# Patient Record
Sex: Female | Born: 1965 | Race: White | Hispanic: No | Marital: Married | Smoking: Never smoker
Health system: Southern US, Community
[De-identification: ages and names within clinical notes are randomized; demographics above are authoritative.]

## PROBLEM LIST (undated history)

## (undated) DIAGNOSIS — F32A Depression, unspecified: Secondary | ICD-10-CM

## (undated) DIAGNOSIS — R87619 Unspecified abnormal cytological findings in specimens from cervix uteri: Secondary | ICD-10-CM

## (undated) DIAGNOSIS — C801 Malignant (primary) neoplasm, unspecified: Secondary | ICD-10-CM

## (undated) DIAGNOSIS — F329 Major depressive disorder, single episode, unspecified: Secondary | ICD-10-CM

## (undated) DIAGNOSIS — J45909 Unspecified asthma, uncomplicated: Secondary | ICD-10-CM

## (undated) DIAGNOSIS — Z8739 Personal history of other diseases of the musculoskeletal system and connective tissue: Secondary | ICD-10-CM

## (undated) DIAGNOSIS — A64 Unspecified sexually transmitted disease: Secondary | ICD-10-CM

## (undated) DIAGNOSIS — T7840XA Allergy, unspecified, initial encounter: Secondary | ICD-10-CM

## (undated) DIAGNOSIS — F419 Anxiety disorder, unspecified: Secondary | ICD-10-CM

## (undated) HISTORY — DX: Anxiety disorder, unspecified: F41.9

## (undated) HISTORY — DX: Malignant (primary) neoplasm, unspecified: C80.1

## (undated) HISTORY — DX: Personal history of other diseases of the musculoskeletal system and connective tissue: Z87.39

## (undated) HISTORY — DX: Depression, unspecified: F32.A

## (undated) HISTORY — DX: Major depressive disorder, single episode, unspecified: F32.9

## (undated) HISTORY — DX: Unspecified asthma, uncomplicated: J45.909

## (undated) HISTORY — DX: Allergy, unspecified, initial encounter: T78.40XA

## (undated) HISTORY — DX: Unspecified abnormal cytological findings in specimens from cervix uteri: R87.619

## (undated) HISTORY — PX: SKIN CANCER EXCISION: SHX779

## (undated) HISTORY — DX: Unspecified sexually transmitted disease: A64

---

## 1998-08-13 DIAGNOSIS — R87619 Unspecified abnormal cytological findings in specimens from cervix uteri: Secondary | ICD-10-CM

## 1998-08-13 HISTORY — DX: Unspecified abnormal cytological findings in specimens from cervix uteri: R87.619

## 1998-08-13 HISTORY — PX: CERVICAL BIOPSY  W/ LOOP ELECTRODE EXCISION: SUR135

## 2015-01-12 ENCOUNTER — Ambulatory Visit (INDEPENDENT_AMBULATORY_CARE_PROVIDER_SITE_OTHER): Payer: 59 | Admitting: Family Medicine

## 2015-01-12 VITALS — BP 128/80 | HR 73 | Temp 97.6°F | Resp 17 | Ht <= 58 in | Wt 136.4 lb

## 2015-01-12 DIAGNOSIS — J302 Other seasonal allergic rhinitis: Secondary | ICD-10-CM | POA: Diagnosis not present

## 2015-01-12 DIAGNOSIS — H109 Unspecified conjunctivitis: Secondary | ICD-10-CM

## 2015-01-12 MED ORDER — OFLOXACIN 0.3 % OP SOLN: 1.0000 [drp] | Freq: Four times a day (QID) | OPHTHALMIC | Status: DC

## 2015-01-12 NOTE — Progress Notes (Signed)
      Chief Complaint:  Chief Complaint  Patient presents with  . Eye Burn    discharge in morning, red     HPI: Kathleen Craig is a 49 y.o. female who is here for  bilateral itching, burning red eyes with minimal discharge in the mornings for the last 1 week. She thought it was just allergies and has been taking allergy medications. She does have a history of allergies. But they are only seasonal. It has not helped. Denies any vision changes, light sensitivity or eye pain.  History reviewed. No pertinent past medical history. History reviewed. No pertinent past surgical history. History   Social History  . Marital Status: Single    Spouse Name: N/A  . Number of Children: N/A  . Years of Education: N/A   Social History Main Topics  . Smoking status: Never Smoker   . Smokeless tobacco: Not on file  . Alcohol Use: Not on file  . Drug Use: Not on file  . Sexual Activity: Not on file   Other Topics Concern  . None   Social History Narrative  . None   Family History  Problem Relation Age of Onset  . Heart disease Maternal Grandfather   . Hypertension Maternal Grandfather    Allergies  Allergen Reactions  . Sulfur Hives   Prior to Admission medications   Not on File     ROS: The patient denies fevers, chills, night sweats, unintentional weight loss, chest pain, palpitations, wheezing, dyspnea on exertion, nausea, vomiting, abdominal pain, dysuria, hematuria, melena, numbness, weakness, or tingling.   All other systems have been reviewed and were otherwise negative with the exception of those mentioned in the HPI and as above.    PHYSICAL EXAM: Filed Vitals:   01/12/15 1900  BP: 128/80  Pulse: 73  Temp: 97.6 F (36.4 C)  Resp: 17   Filed Vitals:   01/12/15 1900  Height: 4\' 10"  (1.473 m)  Weight: 136 lb 6.4 oz (61.871 kg)   Body mass index is 28.52 kg/(m^2).   General: Alert, no acute distress HEENT:  Normocephalic, atraumatic, oropharynx patent. EOMI,  PERRLA, fundodoscopic exam normal, bilateral erythematous sclera Cardiovascular:  Regular rate and rhythm, no rubs murmurs or gallops.  No Carotid bruits, radial pulse intact. No pedal edema.  Respiratory: Clear to auscultation bilaterally.  No wheezes, rales, or rhonchi.  No cyanosis, no use of accessory musculature GI: No organomegaly, abdomen is soft and non-tender, positive bowel sounds.  No masses. Skin: No rashes. Neurologic: Facial musculature symmetric. Psychiatric: Patient is appropriate throughout our interaction. Lymphatic: No cervical lymphadenopathy Musculoskeletal: Gait intact.   LABS: No results found for this or any previous visit.   EKG/XRAY:   Primary read interpreted by Dr. Marin Comment at [facility].   ASSESSMENT/PLAN: Encounter Diagnoses  Name Primary?  . Bilateral conjunctivitis Yes  . Seasonal allergies    Continue to take anti-histamine, also prescribed Ocuflox Follow up as needed  Gross sideeffects, risk and benefits, and alternatives of medications d/w patient. Patient is aware that all medications have potential sideeffects and we are unable to predict every sideeffect or drug-drug interaction that may occur.  LE, [place], DO 01/14/2015 3:17 AM

## 2015-03-23 ENCOUNTER — Encounter: Payer: Self-pay | Admitting: Physician Assistant

## 2015-03-23 ENCOUNTER — Ambulatory Visit (INDEPENDENT_AMBULATORY_CARE_PROVIDER_SITE_OTHER): Payer: 59 | Admitting: Physician Assistant

## 2015-03-23 VITALS — BP 122/79 | HR 74 | Temp 98.7°F | Resp 16 | Ht <= 58 in | Wt 136.4 lb

## 2015-03-23 DIAGNOSIS — A6000 Herpesviral infection of urogenital system, unspecified: Secondary | ICD-10-CM | POA: Insufficient documentation

## 2015-03-23 DIAGNOSIS — F32A Depression, unspecified: Secondary | ICD-10-CM | POA: Insufficient documentation

## 2015-03-23 DIAGNOSIS — F418 Other specified anxiety disorders: Secondary | ICD-10-CM | POA: Diagnosis not present

## 2015-03-23 DIAGNOSIS — R12 Heartburn: Secondary | ICD-10-CM | POA: Diagnosis not present

## 2015-03-23 DIAGNOSIS — F329 Major depressive disorder, single episode, unspecified: Secondary | ICD-10-CM | POA: Insufficient documentation

## 2015-03-23 DIAGNOSIS — J4520 Mild intermittent asthma, uncomplicated: Secondary | ICD-10-CM | POA: Diagnosis not present

## 2015-03-23 DIAGNOSIS — F419 Anxiety disorder, unspecified: Secondary | ICD-10-CM

## 2015-03-23 MED ORDER — VALACYCLOVIR HCL 500 MG PO TABS: ORAL_TABLET | ORAL | Status: DC

## 2015-03-23 MED ORDER — PANTOPRAZOLE SODIUM 40 MG PO TBEC: 40.0000 mg | DELAYED_RELEASE_TABLET | Freq: Every day | ORAL | Status: DC

## 2015-03-23 MED ORDER — LORAZEPAM 0.5 MG PO TABS: 0.5000 mg | ORAL_TABLET | Freq: Every day | ORAL | Status: DC | PRN

## 2015-03-23 MED ORDER — ESCITALOPRAM OXALATE 20 MG PO TABS: 20.0000 mg | ORAL_TABLET | Freq: Every day | ORAL | Status: DC

## 2015-03-23 MED ORDER — FLUTICASONE-SALMETEROL 100-50 MCG/DOSE IN AEPB: 1.0000 | INHALATION_SPRAY | Freq: Two times a day (BID) | RESPIRATORY_TRACT | Status: DC

## 2015-03-23 MED ORDER — ALBUTEROL SULFATE HFA 108 (90 BASE) MCG/ACT IN AERS: 2.0000 | INHALATION_SPRAY | Freq: Four times a day (QID) | RESPIRATORY_TRACT | Status: DC | PRN

## 2015-03-23 NOTE — Progress Notes (Signed)
   Kathleen Craig  MRN: [id number] DOB: 01-25-1966  Subjective:  Pt presents to clinic because she recently moved to [place] and she needs to establish care.  She is doing well with her medications.  She is in need for a PAP and she would like to schedule a CPE for that in the upcoming months.  Patient Active Problem List   Diagnosis Date Noted  . Heartburn 03/23/2015  . Anxiety and depression 03/23/2015  . Asthma, mild intermittent, well-controlled 03/23/2015  . Genital herpes 03/23/2015    No current outpatient prescriptions on file prior to visit.   No current facility-administered medications on file prior to visit.    Allergies  Allergen Reactions  . Sulfur Hives    Review of Systems Objective:  BP 122/79 mmHg  Pulse 74  Temp(Src) 98.7 F (37.1 C) (Oral)  Resp 16  Ht 4\' 10"  (1.473 m)  Wt 136 lb 6.4 oz (61.871 kg)  BMI 28.52 kg/m2  SpO2 98%  LMP 03/20/2015  Physical Exam  Constitutional: She is oriented to person, place, and time and well-developed, well-nourished, and in no distress.  HENT:  Head: Normocephalic and atraumatic.  Right Ear: Hearing and external ear normal.  Left Ear: Hearing and external ear normal.  Eyes: Conjunctivae are normal.  Neck: Normal range of motion.  Cardiovascular: Normal rate, regular rhythm and normal heart sounds.   No murmur heard. Pulmonary/Chest: Effort normal and breath sounds normal. She has no wheezes.  Neurological: She is alert and oriented to person, place, and time. Gait normal.  Skin: Skin is warm and dry.  Psychiatric: Mood, memory, affect and judgment normal.  Vitals reviewed.   Assessment and Plan :  Heartburn - Plan: pantoprazole (PROTONIX) 40 MG tablet  Anxiety and depression - Plan: LORazepam (ATIVAN) 0.5 MG tablet, escitalopram (LEXAPRO) 20 MG tablet  Asthma, mild intermittent, well-controlled - Plan: Fluticasone-Salmeterol (ADVAIR) 100-50 MCG/DOSE AEPB, albuterol (PROVENTIL HFA;VENTOLIN HFA) 108 (90 BASE)  MCG/ACT inhaler  Genital herpes - Plan: valACYclovir (VALTREX) 500 MG tablet  Kathleen Hummingbird PA-C  [facility] 03/23/2015 3:50 PM

## 2015-03-30 ENCOUNTER — Other Ambulatory Visit: Payer: Self-pay

## 2015-03-30 DIAGNOSIS — J4520 Mild intermittent asthma, uncomplicated: Secondary | ICD-10-CM

## 2015-03-30 MED ORDER — ALBUTEROL SULFATE HFA 108 (90 BASE) MCG/ACT IN AERS: 2.0000 | INHALATION_SPRAY | Freq: Four times a day (QID) | RESPIRATORY_TRACT | Status: DC | PRN

## 2015-05-19 ENCOUNTER — Encounter: Payer: Self-pay | Admitting: Physician Assistant

## 2015-05-26 ENCOUNTER — Ambulatory Visit (INDEPENDENT_AMBULATORY_CARE_PROVIDER_SITE_OTHER): Payer: No Typology Code available for payment source | Admitting: Physician Assistant

## 2015-05-26 ENCOUNTER — Other Ambulatory Visit: Payer: Self-pay | Admitting: Physician Assistant

## 2015-05-26 ENCOUNTER — Encounter: Payer: Self-pay | Admitting: Physician Assistant

## 2015-05-26 VITALS — BP 118/83 | HR 71 | Temp 98.1°F | Resp 16 | Ht <= 58 in | Wt 139.0 lb

## 2015-05-26 DIAGNOSIS — Z85828 Personal history of other malignant neoplasm of skin: Secondary | ICD-10-CM | POA: Diagnosis not present

## 2015-05-26 DIAGNOSIS — Z01419 Encounter for gynecological examination (general) (routine) without abnormal findings: Secondary | ICD-10-CM | POA: Diagnosis not present

## 2015-05-26 DIAGNOSIS — Z130 Encounter for screening for diseases of the blood and blood-forming organs and certain disorders involving the immune mechanism: Secondary | ICD-10-CM | POA: Diagnosis not present

## 2015-05-26 DIAGNOSIS — F329 Major depressive disorder, single episode, unspecified: Secondary | ICD-10-CM

## 2015-05-26 DIAGNOSIS — Z9889 Other specified postprocedural states: Secondary | ICD-10-CM

## 2015-05-26 DIAGNOSIS — Z1159 Encounter for screening for other viral diseases: Secondary | ICD-10-CM | POA: Diagnosis not present

## 2015-05-26 DIAGNOSIS — L989 Disorder of the skin and subcutaneous tissue, unspecified: Secondary | ICD-10-CM

## 2015-05-26 DIAGNOSIS — Z13228 Encounter for screening for other metabolic disorders: Secondary | ICD-10-CM

## 2015-05-26 DIAGNOSIS — Z1231 Encounter for screening mammogram for malignant neoplasm of breast: Secondary | ICD-10-CM | POA: Diagnosis not present

## 2015-05-26 DIAGNOSIS — Z13220 Encounter for screening for lipoid disorders: Secondary | ICD-10-CM | POA: Diagnosis not present

## 2015-05-26 DIAGNOSIS — A6000 Herpesviral infection of urogenital system, unspecified: Secondary | ICD-10-CM

## 2015-05-26 DIAGNOSIS — J4520 Mild intermittent asthma, uncomplicated: Secondary | ICD-10-CM

## 2015-05-26 DIAGNOSIS — Z23 Encounter for immunization: Secondary | ICD-10-CM

## 2015-05-26 DIAGNOSIS — Z1329 Encounter for screening for other suspected endocrine disorder: Secondary | ICD-10-CM | POA: Diagnosis not present

## 2015-05-26 DIAGNOSIS — F32A Depression, unspecified: Secondary | ICD-10-CM

## 2015-05-26 DIAGNOSIS — Z0000 Encounter for general adult medical examination without abnormal findings: Secondary | ICD-10-CM | POA: Diagnosis not present

## 2015-05-26 DIAGNOSIS — H9193 Unspecified hearing loss, bilateral: Secondary | ICD-10-CM

## 2015-05-26 DIAGNOSIS — Z114 Encounter for screening for human immunodeficiency virus [HIV]: Secondary | ICD-10-CM | POA: Diagnosis not present

## 2015-05-26 DIAGNOSIS — R12 Heartburn: Secondary | ICD-10-CM

## 2015-05-26 DIAGNOSIS — Z113 Encounter for screening for infections with a predominantly sexual mode of transmission: Secondary | ICD-10-CM

## 2015-05-26 DIAGNOSIS — F418 Other specified anxiety disorders: Secondary | ICD-10-CM

## 2015-05-26 DIAGNOSIS — F419 Anxiety disorder, unspecified: Secondary | ICD-10-CM

## 2015-05-26 LAB — COMPLETE METABOLIC PANEL WITH GFR
ALT: 13 U/L (ref 6–29)
AST: 15 U/L (ref 10–35)
Albumin: 4 g/dL (ref 3.6–5.1)
Alkaline Phosphatase: 78 U/L (ref 33–115)
BILIRUBIN TOTAL: 0.4 mg/dL (ref 0.2–1.2)
BUN: 9 mg/dL (ref 7–25)
CHLORIDE: 101 mmol/L (ref 98–110)
CO2: 25 mmol/L (ref 20–31)
CREATININE: 0.89 mg/dL (ref 0.50–1.10)
Calcium: 8.9 mg/dL (ref 8.6–10.2)
GFR, Est African American: 89 mL/min (ref >=60)
GFR, Est Non African American: 77 mL/min (ref >=60)
GLUCOSE: 74 mg/dL (ref 65–99)
Potassium: 3.9 mmol/L (ref 3.5–5.3)
SODIUM: 137 mmol/L (ref 135–146)
Total Protein: 7.4 g/dL (ref 6.1–8.1)

## 2015-05-26 LAB — CBC WITH DIFFERENTIAL/PLATELET
BASOS ABS: 0.1 10*3/uL (ref 0.0–0.1)
BASOS PCT: 1 % (ref 0–1)
EOS ABS: 0.4 10*3/uL (ref 0.0–0.7)
Eosinophils Relative: 4 % (ref 0–5)
HCT: 38.9 % (ref 36.0–46.0)
Hemoglobin: 13 g/dL (ref 12.0–15.0)
LYMPHS ABS: 2.7 10*3/uL (ref 0.7–4.0)
Lymphocytes Relative: 29 % (ref 12–46)
MCH: 29 pg (ref 26.0–34.0)
MCHC: 33.4 g/dL (ref 30.0–36.0)
MCV: 86.8 fL (ref 78.0–100.0)
MONOS PCT: 9 % (ref 3–12)
MPV: 9.9 fL (ref 8.6–12.4)
Monocytes Absolute: 0.8 10*3/uL (ref 0.1–1.0)
Neutro Abs: 5.2 10*3/uL (ref 1.7–7.7)
Neutrophils Relative %: 57 % (ref 43–77)
PLATELETS: 266 10*3/uL (ref 150–400)
RBC: 4.48 MIL/uL (ref 3.87–5.11)
RDW: 13.4 % (ref 11.5–15.5)
WBC: 9.2 10*3/uL (ref 4.0–10.5)

## 2015-05-26 LAB — LIPID PANEL
Cholesterol: 209 mg/dL — ABNORMAL HIGH (ref 125–200)
HDL: 48 mg/dL (ref >=46)
LDL Cholesterol: 142 mg/dL — ABNORMAL HIGH (ref <130)
Total CHOL/HDL Ratio: 4.4 Ratio (ref <=5.0)
Triglycerides: 96 mg/dL (ref <150)
VLDL: 19 mg/dL (ref <30)

## 2015-05-26 LAB — TSH: TSH: 2.806 u[IU]/mL (ref 0.350–4.500)

## 2015-05-26 MED ORDER — ESCITALOPRAM OXALATE 20 MG PO TABS: 20.0000 mg | ORAL_TABLET | Freq: Every day | ORAL | Status: DC

## 2015-05-26 MED ORDER — FLUTICASONE-SALMETEROL 100-50 MCG/DOSE IN AEPB: 1.0000 | INHALATION_SPRAY | Freq: Two times a day (BID) | RESPIRATORY_TRACT | Status: DC

## 2015-05-26 MED ORDER — VALACYCLOVIR HCL 500 MG PO TABS: ORAL_TABLET | ORAL | Status: DC

## 2015-05-26 MED ORDER — PANTOPRAZOLE SODIUM 40 MG PO TBEC: 40.0000 mg | DELAYED_RELEASE_TABLET | Freq: Every day | ORAL | Status: DC

## 2015-05-26 NOTE — Progress Notes (Signed)
Kathleen Craig  MRN: [id number] DOB: 11-Aug-1966  Subjective:  Pt presents to clinic for her CPE.  She is doing well.  She only has a concern about a lesion on her scalp which has been there for about a year.  It does not bother her but she is scared because she has h/o basal cell removed from her clavicle 2010.  She is also worried about her hearing.  Finds she has to look at people when they talk with her and she feels like it is getting worse.  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 Heartburn 03/23/2015  . Anxiety and depression - rare use of Ativan - about 2x/month 03/23/2015  . Asthma, mild intermittent, well-controlled - almost no use of Albuterol 03/23/2015  . Genital herpes - using valtrex for supression 03/23/2015    Current Outpatient Prescriptions on File Prior to Visit  Medication Sig Dispense Refill  . albuterol (PROVENTIL HFA;VENTOLIN HFA) 108 (90 BASE) MCG/ACT inhaler Inhale 2 puffs into the lungs every 6 (six) hours as needed for wheezing or shortness of breath. 1 Inhaler 0  . LORazepam (ATIVAN) 0.5 MG tablet Take 1 tablet (0.5 mg total) by mouth daily as needed for anxiety. 30 tablet 0   No current facility-administered medications on file prior to visit.    Allergies  Allergen Reactions  . Sulfur Hives    Social History   Social History  . Marital Status: Single    Spouse Name: N/A  . Number of Children: N/A  . Years of Education: N/A   Occupational History  . accounting     [employer]   Social History Main Topics  . Smoking status: Never Smoker   . Smokeless tobacco: None  .  Alcohol Use: 0.0 oz/week    0 Standard drinks or equivalent per week     Comment: 4 drinks a week  . Drug Use: No  . Sexual Activity: Yes    Birth Control/ Protection: Surgical     Comment: husband vasectomy   Other Topics Concern  . None   Social History Narrative   Married   Event organiser with [employer]   Exercise - no   Diet - try to eat healthy - could make some changes    History reviewed. No pertinent past surgical history.  Family History  Problem Relation Age of Onset  . Heart disease Maternal Grandfather   . Hypertension Maternal Grandfather   . Diabetes Mother   . Hyperlipidemia Mother   . Hyperlipidemia Father   . Alzheimer's disease Paternal Grandfather     Review of Systems  Constitutional: Negative.   HENT: Positive for hearing loss (family h/o hearing loss- trouble with all people talking to her - she uses lip reading a lot).   Eyes: Negative.   Respiratory: Negative.   Cardiovascular: Negative.   Gastrointestinal: Negative.   Endocrine: Negative.   Genitourinary: Negative.   Musculoskeletal: Negative.   Skin: Negative.   Allergic/Immunologic: Positive for environmental allergies (uses Flonase).  Neurological: Negative.   Hematological: Negative.   Psychiatric/Behavioral: Negative.    Objective:  BP 118/83 mmHg  Pulse 71  Temp(Src) 98.1 F (36.7 C) (Oral)  Resp 16  Ht 4\' 10"  (1.473 m)  Wt 139 lb (63.05 kg)  BMI 29.06 kg/m2  Physical Exam  Constitutional: She is oriented to person, place, and time and well-developed, well-nourished, and in no distress.  HENT:  Head: Normocephalic and atraumatic.  Right Ear: Hearing, tympanic membrane, external ear and ear canal normal.  Left Ear: Hearing, tympanic membrane, external ear and ear canal normal.  Nose: Nose normal.  Mouth/Throat: Uvula is midline, oropharynx is clear and moist and mucous membranes are normal.  Eyes: Conjunctivae and EOM are normal. Pupils are equal, round, and  reactive to light.  Neck: Trachea normal and normal range of motion. Neck supple. No thyroid mass and no thyromegaly present.  Cardiovascular: Normal rate, regular rhythm and normal heart sounds.   No murmur heard. Pulmonary/Chest: Effort normal and breath sounds normal. She has no wheezes. Right breast exhibits no inverted nipple, no mass, no nipple discharge, no skin change and no tenderness. Left breast exhibits no inverted nipple, no mass, no nipple discharge, no skin change and no tenderness. Breasts are symmetrical.  Abdominal: Soft. Bowel sounds are normal. There is no tenderness.  Genitourinary: Vagina normal, uterus normal, cervix normal, right adnexa normal, left adnexa normal and vulva normal.  Musculoskeletal: Normal range of motion.  Lymphadenopathy:    She has no cervical adenopathy.  Neurological: She is alert and oriented to person, place, and time. She has normal motor skills, normal sensation, normal strength and normal reflexes. Gait normal.  Skin: Skin is warm and dry.  1/2 cm raised pearly lesion with visible blood vessels left scalp about 1in from hair line  Psychiatric: Mood, memory, affect and judgment normal.     Visual Acuity Screening   Right eye Left eye Both eyes  Without correction: 20/20 20/20 20/20   With correction:       Assessment and Plan :  Annual physical exam - anticipatory guidance given  Need for prophylactic vaccination and inoculation against influenza - Plan: Flu Vaccine QUAD 36+ mos IM  Encounter for routine gynecological examination - Plan: Pap IG, CT/NG NAA, and HPV (high risk)  Encounter for screening mammogram for breast cancer - Plan: MM DIGITAL SCREENING BILATERAL  Skin lesion of scalp - Plan: Ambulatory referral to Dermatology  H/O basal cell carcinoma excision - Plan: Ambulatory referral to Dermatology  Screening for deficiency anemia - Plan: CBC with Differential/Platelet  Screening for metabolic disorder - Plan: COMPLETE  METABOLIC PANEL WITH GFR  Screening cholesterol level - Plan: Lipid panel  Screening for thyroid disorder - Plan: TSH  Screening for HIV (human immunodeficiency virus) - Plan: HIV antibody  Need for hepatitis B screening test - Plan: Hepatitis B surface antibody  Need for hepatitis C screening test - Plan: Hepatitis C antibody  Screening for STD (sexually transmitted disease) - Plan: RPR  Need for Tdap vaccination - Plan: Tdap vaccine greater than or equal to 7yo IM  Asthma, mild intermittent, well-controlled - Plan: Fluticasone-Salmeterol (ADVAIR) 100-50 MCG/DOSE AEPB  Anxiety and depression - Plan: escitalopram (LEXAPRO) 20 MG tablet  Heartburn - Plan: pantoprazole (PROTONIX) 40 MG tablet  Genital herpes - Plan: valACYclovir (VALTREX) 500 MG tablet  Hearing loss, bilateral - Plan: Ambulatory referral to Audiology - our audiometry machine is not working and I think that we need to figure out where her hearing  deficit is to see if something needs to be done about it.  Windell Hummingbird PA-C  [facility] 05/26/2015 5:55 PM

## 2015-05-26 NOTE — Patient Instructions (Signed)
Health Maintenance, Female Adopting a healthy lifestyle and getting preventive care can go a long way to promote health and wellness. Talk with your health care provider about what schedule of regular examinations is right for you. This is a good chance for you to check in with your provider about disease prevention and staying healthy. In between checkups, there are plenty of things you can do on your own. Experts have done a lot of research about which lifestyle changes and preventive measures are most likely to keep you healthy. Ask your health care provider for more information. WEIGHT AND DIET  Eat a healthy diet  Be sure to include plenty of vegetables, fruits, low-fat dairy products, and lean protein.  Do not eat a lot of foods high in solid fats, added sugars, or salt.  Get regular exercise. This is one of the most important things you can do for your health.  Most adults should exercise for at least 150 minutes each week. The exercise should increase your heart rate and make you sweat (moderate-intensity exercise).  Most adults should also do strengthening exercises at least twice a week. This is in addition to the moderate-intensity exercise.  Maintain a healthy weight  Body mass index (BMI) is a measurement that can be used to identify possible weight problems. It estimates body fat based on height and weight. Your health care provider can help determine your BMI and help you achieve or maintain a healthy weight.  For females 20 years of age and older:   A BMI below 18.5 is considered underweight.  A BMI of 18.5 to 24.9 is normal.  A BMI of 25 to 29.9 is considered overweight.  A BMI of 30 and above is considered obese.  Watch levels of cholesterol and blood lipids  You should start having your blood tested for lipids and cholesterol at 47 years of age, then have this test every 5 years.  You may need to have your cholesterol levels checked more often if:  Your lipid  or cholesterol levels are high.  You are older than 47 years of age.  You are at high risk for heart disease.  CANCER SCREENING   Lung Cancer  Lung cancer screening is recommended for adults 55-80 years old who are at high risk for lung cancer because of a history of smoking.  A yearly low-dose CT scan of the lungs is recommended for people who:  Currently smoke.  Have quit within the past 15 years.  Have at least a 30-pack-year history of smoking. A pack year is smoking an average of one pack of cigarettes a day for 1 year.  Yearly screening should continue until it has been 15 years since you quit.  Yearly screening should stop if you develop a health problem that would prevent you from having lung cancer treatment.  Breast Cancer  Practice breast self-awareness. This means understanding how your breasts normally appear and feel.  It also means doing regular breast self-exams. Let your health care provider know about any changes, no matter how small.  If you are in your 20s or 30s, you should have a clinical breast exam (CBE) by a health care provider every 1-3 years as part of a regular health exam.  If you are 40 or older, have a CBE every year. Also consider having a breast X-ray (mammogram) every year.  If you have a family history of breast cancer, talk to your health care provider about genetic screening.  If you   are at high risk for breast cancer, talk to your health care provider about having an MRI and a mammogram every year.  Breast cancer gene (BRCA) assessment is recommended for women who have family members with BRCA-related cancers. BRCA-related cancers include:  Breast.  Ovarian.  Tubal.  Peritoneal cancers.  Results of the assessment will determine the need for genetic counseling and BRCA1 and BRCA2 testing. Cervical Cancer Your health care provider may recommend that you be screened regularly for cancer of the pelvic organs (ovaries, uterus, and  vagina). This screening involves a pelvic examination, including checking for microscopic changes to the surface of your cervix (Pap test). You may be encouraged to have this screening done every 3 years, beginning at age 21.  For women ages 30-65, health care providers may recommend pelvic exams and Pap testing every 3 years, or they may recommend the Pap and pelvic exam, combined with testing for human papilloma virus (HPV), every 5 years. Some types of HPV increase your risk of cervical cancer. Testing for HPV may also be done on women of any age with unclear Pap test results.  Other health care providers may not recommend any screening for nonpregnant women who are considered low risk for pelvic cancer and who do not have symptoms. Ask your health care provider if a screening pelvic exam is right for you.  If you have had past treatment for cervical cancer or a condition that could lead to cancer, you need Pap tests and screening for cancer for at least 20 years after your treatment. If Pap tests have been discontinued, your risk factors (such as having a new sexual partner) need to be reassessed to determine if screening should resume. Some women have medical problems that increase the chance of getting cervical cancer. In these cases, your health care provider may recommend more frequent screening and Pap tests. Colorectal Cancer  This type of cancer can be detected and often prevented.  Routine colorectal cancer screening usually begins at 47 years of age and continues through 47 years of age.  Your health care provider may recommend screening at an earlier age if you have risk factors for colon cancer.  Your health care provider may also recommend using home test kits to check for hidden blood in the stool.  A small camera at the end of a tube can be used to examine your colon directly (sigmoidoscopy or colonoscopy). This is done to check for the earliest forms of colorectal  cancer.  Routine screening usually begins at age 50.  Direct examination of the colon should be repeated every 5-10 years through 47 years of age. However, you may need to be screened more often if early forms of precancerous polyps or small growths are found. Skin Cancer  Check your skin from head to toe regularly.  Tell your health care provider about any new moles or changes in moles, especially if there is a change in a mole's shape or color.  Also tell your health care provider if you have a mole that is larger than the size of a pencil eraser.  Always use sunscreen. Apply sunscreen liberally and repeatedly throughout the day.  Protect yourself by wearing long sleeves, pants, a wide-brimmed hat, and sunglasses whenever you are outside. HEART DISEASE, DIABETES, AND HIGH BLOOD PRESSURE   High blood pressure causes heart disease and increases the risk of stroke. High blood pressure is more likely to develop in:  People who have blood pressure in the high end   of the normal range (130-139/85-89 mm Hg).  People who are overweight or obese.  People who are African American.  If you are 38-23 years of age, have your blood pressure checked every 3-5 years. If you are 61 years of age or older, have your blood pressure checked every year. You should have your blood pressure measured twice--once when you are at a hospital or clinic, and once when you are not at a hospital or clinic. Record the average of the two measurements. To check your blood pressure when you are not at a hospital or clinic, you can use:  An automated blood pressure machine at a pharmacy.  A home blood pressure monitor.  If you are between 45 years and 39 years old, ask your health care provider if you should take aspirin to prevent strokes.  Have regular diabetes screenings. This involves taking a blood sample to check your fasting blood sugar level.  If you are at a normal weight and have a low risk for diabetes,  have this test once every three years after 47 years of age.  If you are overweight and have a high risk for diabetes, consider being tested at a younger age or more often. PREVENTING INFECTION  Hepatitis B  If you have a higher risk for hepatitis B, you should be screened for this virus. You are considered at high risk for hepatitis B if:  You were born in a country where hepatitis B is common. Ask your health care provider which countries are considered high risk.  Your parents were born in a high-risk country, and you have not been immunized against hepatitis B (hepatitis B vaccine).  You have HIV or AIDS.  You use needles to inject street drugs.  You live with someone who has hepatitis B.  You have had sex with someone who has hepatitis B.  You get hemodialysis treatment.  You take certain medicines for conditions, including cancer, organ transplantation, and autoimmune conditions. Hepatitis C  Blood testing is recommended for:  Everyone born from [birth year] through 1963.  Anyone with known risk factors for hepatitis C. Sexually transmitted infections (STIs)  You should be screened for sexually transmitted infections (STIs) including gonorrhea and chlamydia if:  You are sexually active and are younger than 47 years of age.  You are older than 47 years of age and your health care provider tells you that you are at risk for this type of infection.  Your sexual activity has changed since you were last screened and you are at an increased risk for chlamydia or gonorrhea. Ask your health care provider if you are at risk.  If you do not have HIV, but are at risk, it may be recommended that you take a prescription medicine daily to prevent HIV infection. This is called pre-exposure prophylaxis (PrEP). You are considered at risk if:  You are sexually active and do not regularly use condoms or know the HIV status of your partner(s).  You take drugs by injection.  You are sexually  active with a partner who has HIV. Talk with your health care provider about whether you are at high risk of being infected with HIV. If you choose to begin PrEP, you should first be tested for HIV. You should then be tested every 3 months for as long as you are taking PrEP.  PREGNANCY   If you are premenopausal and you may become pregnant, ask your health care provider about preconception counseling.  If you may  become pregnant, take 400 to 800 micrograms (mcg) of folic acid every day.  If you want to prevent pregnancy, talk to your health care provider about birth control (contraception). OSTEOPOROSIS AND MENOPAUSE   Osteoporosis is a disease in which the bones lose minerals and strength with aging. This can result in serious bone fractures. Your risk for osteoporosis can be identified using a bone density scan.  If you are 61 years of age or older, or if you are at risk for osteoporosis and fractures, ask your health care provider if you should be screened.  Ask your health care provider whether you should take a calcium or vitamin D supplement to lower your risk for osteoporosis.  Menopause may have certain physical symptoms and risks.  Hormone replacement therapy may reduce some of these symptoms and risks. Talk to your health care provider about whether hormone replacement therapy is right for you.  HOME CARE INSTRUCTIONS   Schedule regular health, dental, and eye exams.  Stay current with your immunizations.   Do not use any tobacco products including cigarettes, chewing tobacco, or electronic cigarettes.  If you are pregnant, do not drink alcohol.  If you are breastfeeding, limit how much and how often you drink alcohol.  Limit alcohol intake to no more than 1 drink per day for nonpregnant women. One drink equals 12 ounces of beer, 5 ounces of wine, or 1 ounces of hard liquor.  Do not use street drugs.  Do not share needles.  Ask your health care provider for help if  you need support or information about quitting drugs.  Tell your health care provider if you often feel depressed.  Tell your health care provider if you have ever been abused or do not feel safe at home.   This information is not intended to replace advice given to you by your health care provider. Make sure you discuss any questions you have with your health care provider.   Document Released: 02/12/2011 Document Revised: 08/20/2014 Document Reviewed: 07/01/2013 Elsevier Interactive Patient Education [employer].

## 2015-05-27 LAB — HIV ANTIBODY (ROUTINE TESTING W REFLEX): HIV: NONREACTIVE

## 2015-05-27 LAB — HEPATITIS C ANTIBODY: HCV AB: NEGATIVE

## 2015-05-27 LAB — HEPATITIS B SURFACE ANTIBODY, QUANTITATIVE: HEPATITIS B-POST: 0 m[IU]/mL

## 2015-05-27 LAB — RPR

## 2015-05-30 ENCOUNTER — Other Ambulatory Visit: Payer: Self-pay

## 2015-05-30 DIAGNOSIS — Z1231 Encounter for screening mammogram for malignant neoplasm of breast: Secondary | ICD-10-CM

## 2015-05-30 LAB — HEPATITIS B SURFACE ANTIGEN: HEP B S AG: NEGATIVE

## 2015-05-31 LAB — PAP IG, CT-NG NAA, HPV HIGH-RISK
CHLAMYDIA PROBE AMP: NEGATIVE
GC PROBE AMP: NEGATIVE
HPV DNA High Risk: DETECTED — AB

## 2015-07-13 ENCOUNTER — Ambulatory Visit: Payer: No Typology Code available for payment source

## 2015-07-19 ENCOUNTER — Ambulatory Visit: Payer: 59 | Attending: Audiology | Admitting: Audiology

## 2015-07-19 DIAGNOSIS — H906 Mixed conductive and sensorineural hearing loss, bilateral: Secondary | ICD-10-CM

## 2015-07-19 DIAGNOSIS — H93292 Other abnormal auditory perceptions, left ear: Secondary | ICD-10-CM

## 2015-07-19 DIAGNOSIS — R94128 Abnormal results of other function studies of ear and other special senses: Secondary | ICD-10-CM | POA: Diagnosis present

## 2015-07-19 DIAGNOSIS — H93211 Auditory recruitment, right ear: Secondary | ICD-10-CM | POA: Diagnosis present

## 2015-07-19 DIAGNOSIS — R292 Abnormal reflex: Secondary | ICD-10-CM | POA: Insufficient documentation

## 2015-07-19 DIAGNOSIS — Z01118 Encounter for examination of ears and hearing with other abnormal findings: Secondary | ICD-10-CM | POA: Diagnosis present

## 2015-07-19 DIAGNOSIS — H748X9 Other specified disorders of middle ear and mastoid, unspecified ear: Secondary | ICD-10-CM
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
## 2015-08-09 ENCOUNTER — Ambulatory Visit
Admission: RE | Admit: 2015-08-09 | Discharge: 2015-08-09 | Disposition: A | Discharge status: Home / Self Care | Payer: 59 | Source: Ambulatory Visit | Attending: Physician Assistant | Admitting: Physician Assistant

## 2015-08-09 DIAGNOSIS — Z1231 Encounter for screening mammogram for malignant neoplasm of breast: Secondary | ICD-10-CM

## 2015-12-19 ENCOUNTER — Other Ambulatory Visit: Payer: Self-pay

## 2015-12-19 ENCOUNTER — Telehealth: Payer: Self-pay

## 2015-12-19 DIAGNOSIS — F419 Anxiety disorder, unspecified: Secondary | ICD-10-CM

## 2015-12-19 DIAGNOSIS — R12 Heartburn: Secondary | ICD-10-CM

## 2015-12-19 DIAGNOSIS — F32A Depression, unspecified: Secondary | ICD-10-CM

## 2015-12-19 DIAGNOSIS — F329 Major depressive disorder, single episode, unspecified: Secondary | ICD-10-CM

## 2015-12-19 MED ORDER — PANTOPRAZOLE SODIUM 40 MG PO TBEC: 40.0000 mg | DELAYED_RELEASE_TABLET | Freq: Every day | ORAL | Status: DC

## 2015-12-19 MED ORDER — ESCITALOPRAM OXALATE 20 MG PO TABS: 20.0000 mg | ORAL_TABLET | Freq: Every day | ORAL | Status: DC

## 2015-12-19 NOTE — Telephone Encounter (Signed)
Pt advised.

## 2015-12-19 NOTE — Telephone Encounter (Signed)
Pt is needing a diflucan rx   Best number [phone]

## 2015-12-19 NOTE — Telephone Encounter (Signed)
Rx refill Lexapro and Pantoprazole.

## 2016-05-09 ENCOUNTER — Ambulatory Visit (INDEPENDENT_AMBULATORY_CARE_PROVIDER_SITE_OTHER): Payer: 59 | Admitting: Physician Assistant

## 2016-05-09 ENCOUNTER — Encounter: Payer: Self-pay | Admitting: Physician Assistant

## 2016-05-09 VITALS — BP 122/72 | HR 84 | Temp 98.9°F | Resp 17 | Ht <= 58 in | Wt 150.0 lb

## 2016-05-09 DIAGNOSIS — Z23 Encounter for immunization: Secondary | ICD-10-CM | POA: Diagnosis not present

## 2016-05-09 DIAGNOSIS — E7800 Pure hypercholesterolemia, unspecified: Secondary | ICD-10-CM | POA: Diagnosis not present

## 2016-05-09 DIAGNOSIS — R12 Heartburn: Secondary | ICD-10-CM | POA: Diagnosis not present

## 2016-05-09 DIAGNOSIS — M62838 Other muscle spasm: Secondary | ICD-10-CM | POA: Diagnosis not present

## 2016-05-09 DIAGNOSIS — F418 Other specified anxiety disorders: Secondary | ICD-10-CM | POA: Diagnosis not present

## 2016-05-09 DIAGNOSIS — J4520 Mild intermittent asthma, uncomplicated: Secondary | ICD-10-CM

## 2016-05-09 DIAGNOSIS — F419 Anxiety disorder, unspecified: Secondary | ICD-10-CM

## 2016-05-09 DIAGNOSIS — F32A Depression, unspecified: Secondary | ICD-10-CM

## 2016-05-09 DIAGNOSIS — A6000 Herpesviral infection of urogenital system, unspecified: Secondary | ICD-10-CM

## 2016-05-09 DIAGNOSIS — F329 Major depressive disorder, single episode, unspecified: Secondary | ICD-10-CM

## 2016-05-09 LAB — COMPLETE METABOLIC PANEL WITH GFR
ALT: 13 U/L (ref 6–29)
AST: 16 U/L (ref 10–35)
Albumin: 3.9 g/dL (ref 3.6–5.1)
Alkaline Phosphatase: 85 U/L (ref 33–115)
BILIRUBIN TOTAL: 0.4 mg/dL (ref 0.2–1.2)
BUN: 12 mg/dL (ref 7–25)
CO2: 24 mmol/L (ref 20–31)
Calcium: 9.3 mg/dL (ref 8.6–10.2)
Chloride: 103 mmol/L (ref 98–110)
Creat: 0.92 mg/dL (ref 0.50–1.10)
GFR, EST AFRICAN AMERICAN: 85 mL/min (ref >=60)
GFR, EST NON AFRICAN AMERICAN: 74 mL/min (ref >=60)
Glucose, Bld: 94 mg/dL (ref 65–99)
POTASSIUM: 4.3 mmol/L (ref 3.5–5.3)
Sodium: 137 mmol/L (ref 135–146)
TOTAL PROTEIN: 7.1 g/dL (ref 6.1–8.1)

## 2016-05-09 LAB — LIPID PANEL
CHOL/HDL RATIO: 4.5 ratio (ref <=5.0)
Cholesterol: 212 mg/dL — ABNORMAL HIGH (ref 125–200)
HDL: 47 mg/dL (ref >=46)
LDL CALC: 136 mg/dL — AB (ref <130)
TRIGLYCERIDES: 145 mg/dL (ref <150)
VLDL: 29 mg/dL (ref <30)

## 2016-05-09 LAB — MAGNESIUM: MAGNESIUM: 1.9 mg/dL (ref 1.5–2.5)

## 2016-05-09 MED ORDER — LORAZEPAM 1 MG PO TABS: 1.0000 mg | ORAL_TABLET | Freq: Every day | ORAL | 0 refills | Status: DC | PRN

## 2016-05-09 MED ORDER — METAXALONE 800 MG PO TABS: 400.0000 mg | ORAL_TABLET | Freq: Three times a day (TID) | ORAL | 1 refills | Status: DC

## 2016-05-09 MED ORDER — ESCITALOPRAM OXALATE 20 MG PO TABS: 20.0000 mg | ORAL_TABLET | Freq: Every day | ORAL | 3 refills | Status: DC

## 2016-05-09 MED ORDER — ALBUTEROL SULFATE HFA 108 (90 BASE) MCG/ACT IN AERS: 2.0000 | INHALATION_SPRAY | Freq: Four times a day (QID) | RESPIRATORY_TRACT | 0 refills | Status: DC | PRN

## 2016-05-09 MED ORDER — VALACYCLOVIR HCL 500 MG PO TABS: ORAL_TABLET | ORAL | 3 refills | Status: DC

## 2016-05-09 MED ORDER — FLUTICASONE-SALMETEROL 100-50 MCG/DOSE IN AEPB: 1.0000 | INHALATION_SPRAY | Freq: Two times a day (BID) | RESPIRATORY_TRACT | 3 refills | Status: DC

## 2016-05-09 MED ORDER — PANTOPRAZOLE SODIUM 40 MG PO TBEC: 40.0000 mg | DELAYED_RELEASE_TABLET | Freq: Every day | ORAL | 3 refills | Status: DC

## 2016-05-09 NOTE — Patient Instructions (Addendum)
  I will contact you with your lab results as soon as they are available.   If you have not heard from me in 2 weeks, please contact me.  The fastest way to get your results is to register for My Chart (see the instructions on the last page of this printout).    IF you received an x-ray today, you will receive an invoice from [facility]. Please contact [facility] at [phone] with questions or concerns regarding your invoice.   IF you received labwork today, you will receive an invoice from [employer]. Please contact [facility] at [phone] with questions or concerns regarding your invoice.   Our billing staff will not be able to assist you with questions regarding bills from these companies.  You will be contacted with the lab results as soon as they are available. The fastest way to get your results is to activate your My Chart account. Instructions are located on the last page of this paperwork. If you have not heard from [place] regarding the results in 2 weeks, please contact this office.

## 2016-05-09 NOTE — Progress Notes (Signed)
Kathleen Craig  MRN: [id number] DOB: [birth year]  Subjective:  Pt presents to clinic for medication refills.  She has been doing well.  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  HENT: Negative.   Respiratory: Negative for shortness of breath and wheezing.   Gastrointestinal: Negative.   Psychiatric/Behavioral: Positive for sleep disturbance (rarely with increased stress).    Patient Active Problem List   Diagnosis Date Noted  . H/O basal cell carcinoma excision 05/26/2015  . Heartburn 03/23/2015  . Anxiety and depression 03/23/2015  . Asthma, mild intermittent, well-controlled 03/23/2015  . Genital herpes 03/23/2015    No current outpatient prescriptions on file prior to visit.   No current facility-administered medications on file prior to visit.     Allergies  Allergen Reactions  . Sulfur Hives    Pt patients past, family and social history were reviewed and updated.  Objective:  BP 122/72 (BP Location: Right Arm, Patient Position: Sitting, Cuff Size: Normal)   Pulse 84   Temp 98.9 F (37.2 C) (Oral)   Resp 17   Ht 4\' 9"  (1.448 m)   Wt 150 lb (68 kg)   LMP 05/02/2016 (Approximate)   SpO2 94%   BMI 32.46 kg/m   Physical Exam  Constitutional: She is  oriented to person, place, and time and well-developed, well-nourished, and in no distress.  HENT:  Head: Normocephalic and atraumatic.  Right Ear: Hearing and external ear normal.  Left Ear: Hearing and external ear normal.  Eyes: Conjunctivae are normal.  Neck: Normal range of motion.  Cardiovascular: Normal rate, regular rhythm and normal heart sounds.   No murmur heard. Pulmonary/Chest: Effort normal and breath sounds normal. She has no wheezes.  Neurological: She is alert and oriented to person, place, and time. Gait normal.  Skin: Skin is warm and dry.  Psychiatric: Mood, memory, affect and judgment normal.  Vitals reviewed.   Assessment and Plan :  Muscle spasms of both lower extremities - Plan: metaxalone (SKELAXIN) 800 MG tablet  Genital herpes - Plan: valACYclovir (VALTREX) 500 MG tablet  Anxiety and depression - well controlled - increased the dose of her ativan as she takes 2 pills at a time - this should last her a year - if her use of ativan is increased she will see me sooner for a possible titration of her lexaproPlan: LORazepam (ATIVAN) 1 MG tablet, escitalopram (LEXAPRO) 20 MG tablet  Asthma, mild intermittent, well-controlled - Plan: Fluticasone-Salmeterol (ADVAIR) 100-50 MCG/DOSE AEPB, albuterol (PROVENTIL HFA;VENTOLIN HFA) 108 (90 Base) MCG/ACT inhaler - well controlled  Heartburn - Plan: COMPLETE METABOLIC PANEL WITH GFR, pantoprazole (PROTONIX) 40 MG tablet, Magnesium  Elevated LDL cholesterol level - Plan:  Lipid panel  Flu vaccine need - Plan: Flu Vaccine QUAD 36+ mos IM  Windell Hummingbird PA-C  [facility] 05/09/2016 9:30 AM

## 2016-10-03 ENCOUNTER — Other Ambulatory Visit: Payer: Self-pay | Admitting: Physician Assistant

## 2016-10-03 DIAGNOSIS — Z1231 Encounter for screening mammogram for malignant neoplasm of breast: Secondary | ICD-10-CM

## 2016-10-05 ENCOUNTER — Other Ambulatory Visit: Payer: Self-pay | Admitting: Physician Assistant

## 2016-10-05 DIAGNOSIS — A6000 Herpesviral infection of urogenital system, unspecified: Secondary | ICD-10-CM

## 2016-10-16 ENCOUNTER — Encounter: Payer: Self-pay | Admitting: Physician Assistant

## 2016-10-16 ENCOUNTER — Ambulatory Visit (INDEPENDENT_AMBULATORY_CARE_PROVIDER_SITE_OTHER): Payer: Commercial Managed Care - PPO | Admitting: Physician Assistant

## 2016-10-16 VITALS — BP 120/80 | HR 82 | Temp 98.3°F | Resp 18 | Ht <= 58 in | Wt 149.0 lb

## 2016-10-16 DIAGNOSIS — F32A Depression, unspecified: Secondary | ICD-10-CM

## 2016-10-16 DIAGNOSIS — Z8739 Personal history of other diseases of the musculoskeletal system and connective tissue: Secondary | ICD-10-CM | POA: Diagnosis not present

## 2016-10-16 DIAGNOSIS — F419 Anxiety disorder, unspecified: Secondary | ICD-10-CM

## 2016-10-16 DIAGNOSIS — R12 Heartburn: Secondary | ICD-10-CM

## 2016-10-16 DIAGNOSIS — F418 Other specified anxiety disorders: Secondary | ICD-10-CM | POA: Diagnosis not present

## 2016-10-16 DIAGNOSIS — Z01419 Encounter for gynecological examination (general) (routine) without abnormal findings: Secondary | ICD-10-CM

## 2016-10-16 DIAGNOSIS — F329 Major depressive disorder, single episode, unspecified: Secondary | ICD-10-CM

## 2016-10-16 DIAGNOSIS — N898 Other specified noninflammatory disorders of vagina: Secondary | ICD-10-CM | POA: Diagnosis not present

## 2016-10-16 DIAGNOSIS — A6000 Herpesviral infection of urogenital system, unspecified: Secondary | ICD-10-CM | POA: Diagnosis not present

## 2016-10-16 DIAGNOSIS — Z0000 Encounter for general adult medical examination without abnormal findings: Secondary | ICD-10-CM

## 2016-10-16 DIAGNOSIS — J4520 Mild intermittent asthma, uncomplicated: Secondary | ICD-10-CM | POA: Diagnosis not present

## 2016-10-16 DIAGNOSIS — R87810 Cervical high risk human papillomavirus (HPV) DNA test positive: Secondary | ICD-10-CM

## 2016-10-16 MED ORDER — VALACYCLOVIR HCL 500 MG PO TABS: ORAL_TABLET | ORAL | 1 refills | Status: DC

## 2016-10-16 MED ORDER — PANTOPRAZOLE SODIUM 40 MG PO TBEC: 40.0000 mg | DELAYED_RELEASE_TABLET | Freq: Every day | ORAL | 1 refills | Status: DC

## 2016-10-16 MED ORDER — ESCITALOPRAM OXALATE 20 MG PO TABS: 20.0000 mg | ORAL_TABLET | Freq: Every day | ORAL | 1 refills | Status: DC

## 2016-10-16 MED ORDER — FLUTICASONE-SALMETEROL 100-50 MCG/DOSE IN AEPB: 1.0000 | INHALATION_SPRAY | Freq: Two times a day (BID) | RESPIRATORY_TRACT | 1 refills | Status: DC

## 2016-10-16 NOTE — Patient Instructions (Addendum)
In order for your supplemental calcium to be effective.  Please take calcium either on an empty stomach or with food that does not contain calcium.  Your body is able only to absorb 500mg  of Calcium at a time from any one source.  Do not take with a MVI with iron because iron does not allow th calcium to be absorbed.  Please take Calcium citrate 500mg  2-3x/day.  This supplement should have Vit D in it and if your pills do not please add addition Vit D 400 IU with each dose.  Caltrate might be one to look at because it has Vit D in it  They will call you about your bone density butI am going to try and get it on Friday with your mammogram.     IF you received an x-ray today, you will receive an invoice from [facility]. Please contact [facility] at [phone] with questions or concerns regarding your invoice.   IF you received labwork today, you will receive an invoice from [facility]. Please contact [facility] at [phone] with questions or concerns regarding your invoice.   Our billing staff will not be able to assist you with questions regarding bills from these companies.  You will be contacted with the lab results as soon as they are available. The fastest way to get your results is to activate your My Chart account. Instructions are located on the last page of this paperwork. If you have not heard from [place] regarding the results in 2 weeks, please contact this office.

## 2016-10-16 NOTE — Progress Notes (Signed)
Kathleen Craig  MRN: [id number] DOB: 1966-06-27  PCP: Elizabeth Sauer  Chief Complaint  Patient presents with  . Gynecologic Exam    PAP smear    Subjective:  Pt presents to clinic for her yearly pap smear as she has history of multiple HPV positive pap smears with normal cells so she has yearly pap smears.  H/o bone density -- on boniva in the past - more than 5 years ago (around 8-10 years ago) she had her last bone density but she has been off of Boniva for years - a lot of prednisone as a child she was told what caused this  Mammo scheduled for Friday.  Review of Systems  Constitutional: Negative.   HENT: Negative.   Eyes: Negative.   Respiratory: Negative.   Cardiovascular: Negative.   Gastrointestinal: Negative.   Endocrine: Negative.   Genitourinary: Negative.   Musculoskeletal: Negative.   Skin: Negative.   Allergic/Immunologic: Negative.   Neurological: Negative.   Hematological: Negative.   Psychiatric/Behavioral: Negative.     Patient Active Problem List   Diagnosis Date Noted  . H/O basal cell carcinoma excision 05/26/2015  . Heartburn - uses her protonix daily  03/23/2015  . Anxiety and depression - rare use of Ativan 03/23/2015  . Asthma, mild intermittent, well-controlled - rare use of albuterol - seems to be with exercise with cold weather 03/23/2015  . Genital herpes 03/23/2015    Current Outpatient Prescriptions on File Prior to Visit  Medication Sig Dispense Refill  . albuterol (PROVENTIL HFA;VENTOLIN HFA) 108 (90 Base) MCG/ACT inhaler Inhale 2 puffs into the lungs every 6 (six) hours as needed for wheezing or shortness of breath. 1 Inhaler 0  . LORazepam (ATIVAN) 1 MG tablet Take 1 tablet (1 mg total) by mouth daily as needed for anxiety. 20 tablet 0  . metaxalone (SKELAXIN) 800 MG tablet Take 0.5-1 tablets (400-800 mg total) by mouth 3 (three) times daily. 90 tablet 1   No current facility-administered medications on file prior to visit.      Allergies  Allergen Reactions  . Sulfur Hives    Pt patients past, family and social history were reviewed and updated.   Objective:  BP 120/80   Pulse 82   Temp 98.3 F (36.8 C) (Oral)   Resp 18   Ht 4\' 9"  (1.448 m)   Wt 149 lb (67.6 kg)   LMP 09/21/2016   SpO2 96%   BMI 32.24 kg/m   Physical Exam  Constitutional: She is oriented to person, place, and time and well-developed, well-nourished, and in no distress.  HENT:  Head: Normocephalic and atraumatic.  Right Ear: Hearing, tympanic membrane, external ear and ear canal normal.  Left Ear: Hearing, tympanic membrane, external ear and ear canal normal.  Nose: Nose normal.  Mouth/Throat: Uvula is midline, oropharynx is clear and moist and mucous membranes are normal.  Eyes: Conjunctivae and EOM are normal. Pupils are equal, round, and reactive to light.  Neck: Trachea normal and normal range of motion. Neck supple. No thyroid mass and no thyromegaly present.  Cardiovascular: Normal rate, regular rhythm and normal heart sounds.   No murmur heard. Pulmonary/Chest: Effort normal and breath sounds normal. She has no wheezes.  Abdominal: Soft. Bowel sounds are normal. There is no tenderness.  Genitourinary: Uterus normal, cervix normal, right adnexa normal, left adnexa normal and vulva normal. Thin  yellow and vaginal discharge found.  Genitourinary Comments: Friable cervix  Musculoskeletal: Normal range of motion.  Lymphadenopathy:  She has no cervical adenopathy.  Neurological: She is alert and oriented to person, place, and time. She has normal motor skills, normal sensation, normal strength and normal reflexes. Gait normal.  Skin: Skin is warm and dry.  Psychiatric: Mood, memory, affect and judgment normal.  Vitals reviewed.   Assessment and Plan :  Annual physical exam  Encounter for gynecological examination without abnormal finding - Plan: Pap IG (Image Guided)  History of osteopenia - check bone density as  the last one she had done was 8-10 years ago and she was placed on Boniva at the time - Plan: VITAMIN D 25 Hydroxy (Vit-D Deficiency, Fractures), DG Bone Density - add Caclium with Vit D 500mg  bid as she eats ok amount of calcium in her diet - will change treatment based on labs and results of bone density.  Cervical high risk HPV (human papillomavirus) test positive - yearly pap smears due to HPV positive 05/2015 - Plan: Pap IG (Image Guided)  Anxiety and depression - Plan: escitalopram (LEXAPRO) 20 MG tablet - continue current medications.  Asthma, mild intermittent, well-controlled - Plan: Fluticasone-Salmeterol (ADVAIR) 100-50 MCG/DOSE AEPB - continue current medications  Genital herpes simplex, unspecified site - Plan: valACYclovir (VALTREX) 500 MG tablet  Heartburn - Plan: pantoprazole (PROTONIX) 40 MG tablet - continue current medications  Vaginal discharge - Plan: Wet prep, genital - check labs  Windell Hummingbird PA-C  [facility] 10/16/2016 11:43 AM

## 2016-10-16 NOTE — Progress Notes (Deleted)
Kathleen Craig  MRN: [id number] DOB: [birth year]  PCP: Elizabeth Sauer  Subjective:  Pt presents to clinic for a CPE.  Last dental exam:  Last vision exam: {findings; last pap:13140} {findings; last mammo:13141} Last colonoscopy: Vaccinations      Tetanus      HPV      Zostavax  {ACTIVITY/EXERCISE:21022990} {DIET HISTORY:20003} {Symptoms; sleep:10079}  Patient Active Problem List   Diagnosis Date Noted  . Cervical high risk HPV (human papillomavirus) test positive 10/16/2016  . History of osteopenia 10/16/2016  . H/O basal cell carcinoma excision 05/26/2015  . Heartburn 03/23/2015  . Anxiety and depression 03/23/2015  . Asthma, mild intermittent, well-controlled 03/23/2015  . Genital herpes 03/23/2015    Review of Systems   Current Outpatient Prescriptions on File Prior to Visit  Medication Sig Dispense Refill  . albuterol (PROVENTIL HFA;VENTOLIN HFA) 108 (90 Base) MCG/ACT inhaler Inhale 2 puffs into the lungs every 6 (six) hours as needed for wheezing or shortness of breath. 1 Inhaler 0  . LORazepam (ATIVAN) 1 MG tablet Take 1 tablet (1 mg total) by mouth daily as needed for anxiety. 20 tablet 0  . metaxalone (SKELAXIN) 800 MG tablet Take 0.5-1 tablets (400-800 mg total) by mouth 3 (three) times daily. 90 tablet 1   No current facility-administered medications on file prior to visit.     Allergies  Allergen Reactions  . Sulfur Hives    Social History   Social History  . Marital status: Single    Spouse name: N/A  . Number of children: N/A  . Years of education: N/A   Occupational History  . accounting     [employer]   Social History Main Topics  . Smoking status: Never Smoker  . Smokeless tobacco: Never Used  . Alcohol use 0.0 oz/week     Comment: 4 drinks a week  . Drug use: No  . Sexual activity: Yes    Birth control/ protection: Surgical     Comment: husband vasectomy   Other Topics Concern  . None   Social History Narrative   Married     Event organiser with [employer]   Exercise - no   Diet - try to eat healthy - could make some changes    History reviewed. No pertinent surgical history.  Family History  Problem Relation Age of Onset  . Heart disease Maternal Grandfather   . Hypertension Maternal Grandfather   . Diabetes Mother   . Hyperlipidemia Mother   . Hyperlipidemia Father   . Alzheimer's disease Paternal Grandfather      Objective:  BP 120/80   Pulse 82   Temp 98.3 F (36.8 C) (Oral)   Resp 18   Ht 4\' 9"  (1.448 m)   Wt 149 lb (67.6 kg)   LMP 09/21/2016   SpO2 96%   BMI 32.24 kg/m   Physical Exam No exam data present  Assessment and Plan :  Annual physical exam  Encounter for gynecological examination without abnormal finding - Plan: Pap IG (Image Guided)  History of osteopenia - check bone density as the last one she had done was 8-10 years ago and she was placed on Boniva at the time - Plan: VITAMIN D 25 Hydroxy (Vit-D Deficiency, Fractures), DG Bone Density  Cervical high risk HPV (human papillomavirus) test positive - yearly pap smears due to HPV positive 05/2015 - Plan: Pap IG (Image Guided)  Anxiety and depression - Plan: escitalopram (LEXAPRO) 20 MG tablet  Asthma,  mild intermittent, well-controlled - Plan: Fluticasone-Salmeterol (ADVAIR) 100-50 MCG/DOSE AEPB  Genital herpes simplex, unspecified site - Plan: valACYclovir (VALTREX) 500 MG tablet  Heartburn - Plan: pantoprazole (PROTONIX) 40 MG tablet  Vaginal discharge - Plan: CANCELED: Wet prep, genital  Windell Hummingbird PA-C  [facility] 10/16/2016 12:21 PM

## 2016-10-17 LAB — VITAMIN D 25 HYDROXY (VIT D DEFICIENCY, FRACTURES): VIT D 25 HYDROXY: 30.2 ng/mL (ref 30.0–100.0)

## 2016-10-18 LAB — PAP IG (IMAGE GUIDED): PAP SMEAR COMMENT: 0

## 2016-10-18 LAB — WET PREP FOR TRICH, YEAST, CLUE
CLUE CELL EXAM: NEGATIVE
TRICHOMONAS EXAM: NEGATIVE
YEAST EXAM: NEGATIVE

## 2016-10-19 ENCOUNTER — Ambulatory Visit
Admission: RE | Admit: 2016-10-19 | Discharge: 2016-10-19 | Disposition: A | Discharge status: Home / Self Care | Payer: Commercial Managed Care - PPO | Source: Ambulatory Visit | Attending: Physician Assistant | Admitting: Physician Assistant

## 2016-10-19 DIAGNOSIS — Z1231 Encounter for screening mammogram for malignant neoplasm of breast: Secondary | ICD-10-CM

## 2016-10-24 ENCOUNTER — Other Ambulatory Visit: Payer: 59

## 2016-11-13 ENCOUNTER — Ambulatory Visit
Admission: RE | Admit: 2016-11-13 | Discharge: 2016-11-13 | Disposition: A | Discharge status: Home / Self Care | Payer: Commercial Managed Care - PPO | Source: Ambulatory Visit | Attending: Physician Assistant | Admitting: Physician Assistant

## 2016-11-13 DIAGNOSIS — Z13820 Encounter for screening for osteoporosis: Secondary | ICD-10-CM | POA: Diagnosis not present

## 2016-11-13 DIAGNOSIS — Z8739 Personal history of other diseases of the musculoskeletal system and connective tissue: Secondary | ICD-10-CM

## 2017-07-06 ENCOUNTER — Encounter: Payer: Self-pay | Admitting: Physician Assistant

## 2017-07-06 ENCOUNTER — Other Ambulatory Visit: Payer: Self-pay

## 2017-07-06 ENCOUNTER — Ambulatory Visit: Payer: Commercial Managed Care - PPO | Admitting: Physician Assistant

## 2017-07-06 VITALS — BP 110/86 | HR 93 | Temp 98.5°F | Resp 18 | Ht <= 58 in | Wt 152.0 lb

## 2017-07-06 DIAGNOSIS — R82998 Other abnormal findings in urine: Secondary | ICD-10-CM | POA: Diagnosis not present

## 2017-07-06 DIAGNOSIS — N939 Abnormal uterine and vaginal bleeding, unspecified: Secondary | ICD-10-CM

## 2017-07-06 LAB — POCT WET + KOH PREP
TRICH BY WET PREP: ABSENT
YEAST BY WET PREP: ABSENT
Yeast by KOH: ABSENT

## 2017-07-06 LAB — POCT URINALYSIS DIP (MANUAL ENTRY)
BILIRUBIN UA: NEGATIVE
BILIRUBIN UA: NEGATIVE mg/dL
Glucose, UA: NEGATIVE mg/dL
Nitrite, UA: NEGATIVE
PH UA: 6 (ref 5.0–8.0)
PROTEIN UA: NEGATIVE mg/dL
SPEC GRAV UA: 1.02 (ref 1.010–1.025)
Urobilinogen, UA: 0.2 E.U./dL

## 2017-07-06 LAB — POCT CBC
Granulocyte percent: 73 %G (ref 37–80)
HEMATOCRIT: 37.4 % — AB (ref 37.7–47.9)
HEMOGLOBIN: 12.5 g/dL (ref 12.2–16.2)
Lymph, poc: 2.1 (ref 0.6–3.4)
MCH: 28.7 pg (ref 27–31.2)
MCHC: 33.4 g/dL (ref 31.8–35.4)
MCV: 86 fL (ref 80–97)
MID (CBC): 0.3 (ref 0–0.9)
MPV: 7.4 fL (ref 0–99.8)
POC GRANULOCYTE: 6.4 (ref 2–6.9)
POC LYMPH PERCENT: 23.9 %L (ref 10–50)
POC MID %: 3.1 %M (ref 0–12)
Platelet Count, POC: 277 10*3/uL (ref 142–424)
RBC: 4.35 M/uL (ref 4.04–5.48)
RDW, POC: 13 %
WBC: 8.7 10*3/uL (ref 4.6–10.2)

## 2017-07-06 LAB — POC MICROSCOPIC URINALYSIS (UMFC): Mucus: ABSENT

## 2017-07-06 LAB — POCT URINE PREGNANCY: Preg Test, Ur: NEGATIVE

## 2017-07-06 NOTE — Patient Instructions (Signed)
     IF you received an x-ray today, you will receive an invoice from [facility]. Please contact  Radiology at [phone] with questions or concerns regarding your invoice.   IF you received labwork today, you will receive an invoice from [facility]. Please contact [facility] at [phone] with questions or concerns regarding your invoice.   Our billing staff will not be able to assist you with questions regarding bills from these companies.  You will be contacted with the lab results as soon as they are available. The fastest way to get your results is to activate your My Chart account. Instructions are located on the last page of this paperwork. If you have not heard from us regarding the results in 2 weeks, please contact this office.     

## 2017-07-06 NOTE — Progress Notes (Signed)
Patient ID: Kathleen Craig, female    DOB: 06/10/1966, 51 y.o.   MRN: [id number]  PCP: Mancel Bale, PA-C  Chief Complaint  Patient presents with  . abnormal bleeding    x2weeks pt states its like a bloody discharge and also that it is pretty thick in the morning     Subjective:   Presents for evaluation of abnormal vaginal bleeding.  LMP 2 weeks ago. Normal for her, but then bleeding didn't completely stop. Small amount of brown discharge when she wipes. Heavier in the morning when she first gets up and urinates. Reports the volume of discharge is normal for her, but it's not typically brown. Menses have been becoming irregular, sometimes skips a month.  No associated vaginal itching, burning. No urinary urgency, frequency or burning. No genital lesions. No fever, chills. No enlarged or tender lymph nodes. History of abnormal pap. S/p LOOP. Daily suppressive therapy for HSV. No recent outbreaks. Sexually active with one female partner. No suspicions that he has other partners.  Review of Systems As above.    Patient Active Problem List   Diagnosis Date Noted  . Cervical high risk HPV (human papillomavirus) test positive 10/16/2016  . History of osteopenia 10/16/2016  . H/O basal cell carcinoma excision 05/26/2015  . Heartburn 03/23/2015  . Anxiety and depression 03/23/2015  . Asthma, mild intermittent, well-controlled 03/23/2015  . Genital herpes 03/23/2015     Prior to Admission medications   Medication Sig Start Date End Date Taking? Authorizing Provider  albuterol (PROVENTIL HFA;VENTOLIN HFA) 108 (90 Base) MCG/ACT inhaler Inhale 2 puffs into the lungs every 6 (six) hours as needed for wheezing or shortness of breath. 05/09/16  Yes Weber, Sarah L, PA-C  escitalopram (LEXAPRO) 20 MG tablet Take 1 tablet (20 mg total) by mouth daily. 10/16/16  Yes Weber, Damaris Hippo, PA-C  Fluticasone-Salmeterol (ADVAIR) 100-50 MCG/DOSE AEPB Inhale 1 puff into the lungs 2 (two) times  daily. 10/16/16  Yes Weber, Sarah L, PA-C  LORazepam (ATIVAN) 1 MG tablet Take 1 tablet (1 mg total) by mouth daily as needed for anxiety. 05/09/16  Yes Weber, Damaris Hippo, PA-C  metaxalone (SKELAXIN) 800 MG tablet Take 0.5-1 tablets (400-800 mg total) by mouth 3 (three) times daily. 05/09/16  Yes Weber, Sarah L, PA-C  pantoprazole (PROTONIX) 40 MG tablet Take 1 tablet (40 mg total) by mouth daily. 10/16/16  Yes Weber, Damaris Hippo, PA-C  valACYclovir (VALTREX) 500 MG tablet 500mg  po qd and then increase to 1 po bid for 3 days for outbreak 10/16/16  Yes Weber, Damaris Hippo, PA-C     Allergies  Allergen Reactions  . Sulfur Hives       Objective:  Physical Exam  Constitutional: She is oriented to person, place, and time. She appears well-developed and well-nourished. She is active and cooperative. No distress.  BP 110/86   Pulse 93   Temp 98.5 F (36.9 C) (Oral)   Resp 18   Ht 4\' 9"  (1.448 m)   Wt 152 lb (68.9 kg)   LMP 06/28/2017   SpO2 95%   BMI 32.89 kg/m    Eyes: Conjunctivae are normal.  Pulmonary/Chest: Effort normal.  Abdominal: Hernia confirmed negative in the right inguinal area and confirmed negative in the left inguinal area.  Genitourinary: No labial fusion. There is no rash, tenderness, lesion or injury on the right labia. There is no rash, tenderness, lesion or injury on the left labia. Uterus is not deviated, not enlarged, not fixed  and not tender. Cervix exhibits no motion tenderness and no discharge. Right adnexum displays no mass, no tenderness and no fullness. Left adnexum displays no mass, no tenderness and no fullness. No erythema, tenderness or bleeding in the vagina. No foreign body in the vagina. No signs of injury around the vagina. Vaginal discharge (small amount of light brown discharge) found.  Lymphadenopathy:       Right: No inguinal adenopathy present.       Left: No inguinal adenopathy present.  Neurological: She is alert and oriented to person, place, and time.    Psychiatric: She has a normal mood and affect. Her speech is normal and behavior is normal.     Results for orders placed or performed in visit on 07/06/17  POCT Wet + KOH Prep  Result Value Ref Range   Yeast by KOH Absent Absent   Yeast by wet prep Absent Absent   WBC by wet prep Moderate (A) Few   Clue Cells Wet Prep HPF POC Few (A) None   Trich by wet prep Absent Absent   Bacteria Wet Prep HPF POC None (A) Few   Epithelial Cells By [employer] Pref ([facility]) Moderate (A) None, Few, Too numerous to count   RBC,UR,HPF,POC Few (A) None RBC/hpf  POCT urinalysis dipstick  Result Value Ref Range   Color, UA yellow yellow   Clarity, UA cloudy (A) clear   Glucose, UA negative negative mg/dL   Bilirubin, UA negative negative   Ketones, POC UA negative negative mg/dL   Spec Grav, UA 1.020 1.010 - 1.025   Blood, UA large (A) negative   pH, UA 6.0 5.0 - 8.0   Protein Ur, POC negative negative mg/dL   Urobilinogen, UA 0.2 0.2 or 1.0 E.U./dL   Nitrite, UA Negative Negative   Leukocytes, UA Large (3+) (A) Negative  POCT urine pregnancy  Result Value Ref Range   Preg Test, Ur Negative Negative  POCT Microscopic Urinalysis ([facility])  Result Value Ref Range   WBC,UR,HPF,POC Many (A) None WBC/hpf   RBC,UR,HPF,POC Moderate (A) None RBC/hpf   Bacteria None None, Too numerous to count   Mucus Absent Absent   Epithelial Cells, UR Per Microscopy Few (A) None, Too numerous to count cells/hpf  POCT CBC  Result Value Ref Range   WBC 8.7 4.6 - 10.2 K/uL   Lymph, poc 2.1 0.6 - 3.4   POC LYMPH PERCENT 23.9 10 - 50 %L   MID (cbc) 0.3 0 - 0.9   POC MID % 3.1 0 - 12 %M   POC Granulocyte 6.4 2 - 6.9   Granulocyte percent 73.0 37 - 80 %G   RBC 4.35 4.04 - 5.48 M/uL   Hemoglobin 12.5 12.2 - 16.2 g/dL   HCT, POC 37.4 (A) 37.7 - 47.9 %   MCV 86.0 80 - 97 fL   MCH, POC 28.7 27 - 31.2 pg   MCHC 33.4 31.8 - 35.4 g/dL   RDW, POC 13.0 %   Platelet Count, POC 277 142 - 424 K/uL   MPV 7.4 0 - 99.8 fL        Assessment & Plan:   1. Abnormal vaginal bleeding Await remaining labs. Consider treatment for BV, though there are only a few clue cells on wet prep. If symptoms persist, consider pelvic US. - POCT Wet + KOH Prep - TSH - POCT urinalysis dipstick - POCT urine pregnancy - POCT Microscopic Urinalysis ([facility]) - POCT CBC - GC/Chlamydia Probe Amp  2. Urine white  blood cells increased Await UCx. - Urine Culture    Return if symptoms worsen or fail to improve, or as indicated by lab results.   Fara Chute, PA-C [facility]

## 2017-07-07 LAB — TSH: TSH: 3.2 u[IU]/mL (ref 0.450–4.500)

## 2017-07-08 LAB — URINE CULTURE

## 2017-07-09 LAB — GC/CHLAMYDIA PROBE AMP
Chlamydia trachomatis, NAA: NEGATIVE
NEISSERIA GONORRHOEAE BY PCR: NEGATIVE

## 2017-07-11 ENCOUNTER — Other Ambulatory Visit: Payer: Self-pay | Admitting: Physician Assistant

## 2017-07-11 MED ORDER — AMOXICILLIN 875 MG PO TABS: 875.0000 mg | ORAL_TABLET | Freq: Two times a day (BID) | ORAL | 0 refills | Status: AC

## 2017-08-22 ENCOUNTER — Ambulatory Visit: Payer: Commercial Managed Care - PPO | Admitting: Obstetrics and Gynecology

## 2017-08-22 ENCOUNTER — Encounter: Payer: Self-pay | Admitting: Obstetrics and Gynecology

## 2017-08-22 ENCOUNTER — Other Ambulatory Visit (HOSPITAL_COMMUNITY)
Admission: RE | Admit: 2017-08-22 | Discharge: 2017-08-22 | Disposition: A | Discharge status: Home / Self Care | Payer: Commercial Managed Care - PPO | Source: Ambulatory Visit | Attending: Obstetrics and Gynecology | Admitting: Obstetrics and Gynecology

## 2017-08-22 ENCOUNTER — Other Ambulatory Visit: Payer: Self-pay

## 2017-08-22 VITALS — BP 118/72 | HR 70 | Resp 14 | Ht <= 58 in | Wt 152.6 lb

## 2017-08-22 DIAGNOSIS — Z124 Encounter for screening for malignant neoplasm of cervix: Secondary | ICD-10-CM | POA: Diagnosis not present

## 2017-08-22 DIAGNOSIS — N898 Other specified noninflammatory disorders of vagina: Secondary | ICD-10-CM | POA: Diagnosis not present

## 2017-08-22 DIAGNOSIS — E559 Vitamin D deficiency, unspecified: Secondary | ICD-10-CM

## 2017-08-22 DIAGNOSIS — N882 Stricture and stenosis of cervix uteri: Secondary | ICD-10-CM

## 2017-08-22 DIAGNOSIS — Z01419 Encounter for gynecological examination (general) (routine) without abnormal findings: Secondary | ICD-10-CM

## 2017-08-22 DIAGNOSIS — Z0000 Encounter for general adult medical examination without abnormal findings: Secondary | ICD-10-CM | POA: Diagnosis not present

## 2017-08-22 DIAGNOSIS — N926 Irregular menstruation, unspecified: Secondary | ICD-10-CM | POA: Diagnosis not present

## 2017-08-22 LAB — POCT URINE PREGNANCY: PREG TEST UR: NEGATIVE

## 2017-08-22 MED ORDER — MISOPROSTOL 200 MCG PO TABS: ORAL_TABLET | ORAL | 0 refills | Status: DC

## 2017-08-22 NOTE — Patient Instructions (Signed)

## 2017-08-22 NOTE — Progress Notes (Signed)
52 y.o. G1P0 Domestic PartnerCaucasianF here for annual exam.  Her cycles have been irregular for the last year. Cycles have been every 1-3 months. Typically bleeds x 3-5 days. In November she bleed for 2 weeks. Normal cycle in December x 7 days. In the last month she has had irregular spotting. Occasional postcoital spotting in the last few months. No dyspareunia. She does intermittently notice an increase in vaginal d/c, occasional itching.  Period Cycle (Days): (irregular cycles x 1 year) Period Duration (Days): 3-5 days Period Pattern: (!) Irregular Menstrual Flow: (light to moderate) Menstrual Control: Maxi pad Menstrual Control Change Freq (Hours): every 3-4 hours on heaviest day Dysmenorrhea: None  Patient's last menstrual period was 07/27/2017 (approximate).          Sexually active: Yes.    The current method of family planning is vasectomy.    Exercising: No.   Smoker:  no  Health Maintenance: Pap:  10/2016 Neg History of abnormal Pap:  Yes, 2000 hx of LEEP--done in [place] MMG:  10/2016 Density C/Neg/BiRads1 Colonoscopy:  n/a BMD:   11-13-16 normal:TBC--Hx Osteopenia and hx of Boniva. She was on prednisone secondary to asthma. Last DEXA was normal.  TDaP:  05-26-15 Gardasil: No  UPT: Neg    reports that  has never smoked. she has never used smokeless tobacco. She reports that she drinks about 0.6 - 1.8 oz of alcohol per week. She reports that she does not use drugs. She is an Web designer. Lives with her partner. Daughter is 78  Past Medical History:  Diagnosis Date  . Abnormal Pap smear of cervix 2000   Hx of LEEP and paps normal since  . Cancer ([place]) 2016, 2010   basal cell on head and collarbone  . Depression   . Hx of osteopenia   . STD (sexually transmitted disease)    HSV II    Past Surgical History:  Procedure Laterality Date  . CERVICAL BIOPSY  W/ LOOP ELECTRODE EXCISION  2000   in [place]    Current Outpatient Medications  Medication Sig Dispense  Refill  . albuterol (PROVENTIL HFA;VENTOLIN HFA) 108 (90 Base) MCG/ACT inhaler Inhale 2 puffs into the lungs every 6 (six) hours as needed for wheezing or shortness of breath. 1 Inhaler 0  . escitalopram (LEXAPRO) 20 MG tablet Take 1 tablet (20 mg total) by mouth daily. 90 tablet 1  . Fluticasone-Salmeterol (ADVAIR) 100-50 MCG/DOSE AEPB Inhale 1 puff into the lungs 2 (two) times daily. 180 each 1  . LORazepam (ATIVAN) 1 MG tablet Take 1 tablet (1 mg total) by mouth daily as needed for anxiety. 20 tablet 0  . metaxalone (SKELAXIN) 800 MG tablet Take 0.5-1 tablets (400-800 mg total) by mouth 3 (three) times daily. 90 tablet 1  . Multiple Vitamin (MULTIVITAMIN) capsule Take 1 capsule by mouth daily.    . pantoprazole (PROTONIX) 40 MG tablet Take 1 tablet (40 mg total) by mouth daily. 90 tablet 1  . valACYclovir (VALTREX) 500 MG tablet 500mg  po qd and then increase to 1 po bid for 3 days for outbreak 90 tablet 1   No current facility-administered medications for this visit.     Family History  Problem Relation Age of Onset  . Heart disease Maternal Grandfather   . Hypertension Maternal Grandfather   . Diabetes Mother   . Hyperlipidemia Mother   . Hypertension Mother   . Hyperlipidemia Father   . Alzheimer's disease Paternal Grandfather     Review of Systems  Constitutional:  Negative.   HENT: Negative.   Eyes: Negative.   Respiratory: Negative.   Cardiovascular: Negative.   Gastrointestinal: Negative.   Endocrine: Negative.   Genitourinary: Negative.   Musculoskeletal: Negative.   Skin: Negative.   Allergic/Immunologic: Negative.   Neurological: Negative.   Psychiatric/Behavioral: Negative.     Exam:   BP 118/72 (BP Location: Right Arm, Patient Position: Sitting, Cuff Size: Large)   Pulse 70   Resp 14   Ht 4\' 9"  (1.448 m)   Wt 152 lb 9.6 oz (69.2 kg)   LMP 07/27/2017 (Approximate)   BMI 33.02 kg/m   Weight change: @WEIGHTCHANGE @ Height:   Height: 4\' 9"  (144.8 cm)  Ht  Readings from Last 3 Encounters:  08/22/17 4\' 9"  (1.448 m)  07/06/17 4\' 9"  (1.448 m)  10/16/16 4\' 9"  (1.448 m)    General appearance: alert, cooperative and appears stated age Head: Normocephalic, without obvious abnormality, atraumatic Neck: no adenopathy, supple, symmetrical, trachea midline and thyroid normal to inspection and palpation Lungs: clear to auscultation bilaterally Cardiovascular: regular rate and rhythm Breasts: normal appearance, no masses or tenderness Abdomen: soft, non-tender; non distended,  no masses,  no organomegaly Extremities: extremities normal, atraumatic, no cyanosis or edema Skin: Skin color, texture, turgor normal. No rashes or lesions Lymph nodes: Cervical, supraclavicular, and axillary nodes normal. No abnormal inguinal nodes palpated Neurologic: Grossly normal   Pelvic: External genitalia:  no lesions, area of erythema, slight whitening and fissure above the clitoris              Urethra:  normal appearing urethra with no masses, tenderness or lesions              Bartholins and Skenes: normal                 Vagina: normal appearing vagina with an increase in watery, yellow vaginal d/c              Cervix: no lesions and stenotic               Bimanual Exam:  Uterus:  normal size, contour, position, consistency, mobility, non-tender              Adnexa: no mass, fullness, tenderness               Rectovaginal: Confirms               Anus:  normal sphincter tone, no lesions  Chaperone was present for exam.  A:  Well Woman with normal exam  Abnormal uterine bleeding  Vaginal d/c  H/O vit D def  P:   Pap with hpv  H/O osteopenia, last DEXA was normal (was treated)  Return for endometrial biopsy  Return for fasting labs  Mammogram in March  Discussed colonoscopy, she will consider. Would like to try the cologuard  UPT negative  After her biopsy, discussed cyclic provera  Affirm

## 2017-08-23 LAB — VAGINITIS/VAGINOSIS, DNA PROBE
Candida Species: NEGATIVE
Gardnerella vaginalis: NEGATIVE
Trichomonas vaginosis: NEGATIVE

## 2017-08-26 LAB — CYTOLOGY - PAP
Diagnosis: NEGATIVE
HPV (WINDOPATH): NOT DETECTED

## 2017-08-27 ENCOUNTER — Telehealth: Payer: Self-pay | Admitting: Obstetrics and Gynecology

## 2017-08-27 NOTE — Telephone Encounter (Signed)
Spoke with patient regarding benefit for endometrial biopsy. Patient understood and agreeable. Patient advised to call at the onset of her cycle for scheduling. Patient acknowledged understanding and is agreeable. Ok to close   cc: Dr Talbert Nan  cc: Glorianne Manchester, RN

## 2017-08-28 ENCOUNTER — Other Ambulatory Visit (INDEPENDENT_AMBULATORY_CARE_PROVIDER_SITE_OTHER): Payer: Commercial Managed Care - PPO

## 2017-08-28 DIAGNOSIS — E559 Vitamin D deficiency, unspecified: Secondary | ICD-10-CM | POA: Diagnosis not present

## 2017-08-28 DIAGNOSIS — N926 Irregular menstruation, unspecified: Secondary | ICD-10-CM

## 2017-08-28 DIAGNOSIS — Z0000 Encounter for general adult medical examination without abnormal findings: Secondary | ICD-10-CM | POA: Diagnosis not present

## 2017-08-29 LAB — LIPID PANEL
Chol/HDL Ratio: 5.1 ratio — ABNORMAL HIGH (ref 0.0–4.4)
Cholesterol, Total: 219 mg/dL — ABNORMAL HIGH (ref 100–199)
HDL: 43 mg/dL (ref >39)
LDL Calculated: 150 mg/dL — ABNORMAL HIGH (ref 0–99)
TRIGLYCERIDES: 132 mg/dL (ref 0–149)
VLDL Cholesterol Cal: 26 mg/dL (ref 5–40)

## 2017-08-29 LAB — CBC
HEMATOCRIT: 38.2 % (ref 34.0–46.6)
HEMOGLOBIN: 12.2 g/dL (ref 11.1–15.9)
MCH: 27.9 pg (ref 26.6–33.0)
MCHC: 31.9 g/dL (ref 31.5–35.7)
MCV: 87 fL (ref 79–97)
Platelets: 288 10*3/uL (ref 150–379)
RBC: 4.38 x10E6/uL (ref 3.77–5.28)
RDW: 14 % (ref 12.3–15.4)
WBC: 7.2 10*3/uL (ref 3.4–10.8)

## 2017-08-29 LAB — COMPREHENSIVE METABOLIC PANEL
A/G RATIO: 1.3 (ref 1.2–2.2)
ALT: 23 IU/L (ref 0–32)
AST: 27 IU/L (ref 0–40)
Albumin: 4.3 g/dL (ref 3.5–5.5)
Alkaline Phosphatase: 104 IU/L (ref 39–117)
BUN / CREAT RATIO: 10 (ref 9–23)
BUN: 11 mg/dL (ref 6–24)
Bilirubin Total: 0.3 mg/dL (ref 0.0–1.2)
CALCIUM: 9.3 mg/dL (ref 8.7–10.2)
CO2: 23 mmol/L (ref 20–29)
Chloride: 102 mmol/L (ref 96–106)
Creatinine, Ser: 1.07 mg/dL — ABNORMAL HIGH (ref 0.57–1.00)
GFR, EST AFRICAN AMERICAN: 70 mL/min/{1.73_m2} (ref >59)
GFR, EST NON AFRICAN AMERICAN: 61 mL/min/{1.73_m2} (ref >59)
GLOBULIN, TOTAL: 3.3 g/dL (ref 1.5–4.5)
Glucose: 97 mg/dL (ref 65–99)
POTASSIUM: 4.8 mmol/L (ref 3.5–5.2)
SODIUM: 142 mmol/L (ref 134–144)
TOTAL PROTEIN: 7.6 g/dL (ref 6.0–8.5)

## 2017-08-29 LAB — TSH: TSH: 2.12 u[IU]/mL (ref 0.450–4.500)

## 2017-08-29 LAB — VITAMIN D 25 HYDROXY (VIT D DEFICIENCY, FRACTURES): Vit D, 25-Hydroxy: 33.9 ng/mL (ref 30.0–100.0)

## 2017-10-29 DIAGNOSIS — L821 Other seborrheic keratosis: Secondary | ICD-10-CM | POA: Diagnosis not present

## 2017-10-29 DIAGNOSIS — L570 Actinic keratosis: Secondary | ICD-10-CM | POA: Diagnosis not present

## 2017-10-29 DIAGNOSIS — D225 Melanocytic nevi of trunk: Secondary | ICD-10-CM | POA: Diagnosis not present

## 2017-10-29 DIAGNOSIS — L820 Inflamed seborrheic keratosis: Secondary | ICD-10-CM | POA: Diagnosis not present

## 2017-10-29 DIAGNOSIS — L300 Nummular dermatitis: Secondary | ICD-10-CM | POA: Diagnosis not present

## 2017-11-15 ENCOUNTER — Telehealth: Payer: Self-pay | Admitting: *Deleted

## 2017-11-15 NOTE — Telephone Encounter (Signed)
Detailed message left on mobile number per DPR advising patient Cologuard had reached out stating patient had not completed Cologuard kit. Advised patient to return call to office to update if she was still interested in completing.

## 2017-11-25 ENCOUNTER — Other Ambulatory Visit: Payer: Self-pay | Admitting: Physician Assistant

## 2017-11-25 DIAGNOSIS — F32A Depression, unspecified: Secondary | ICD-10-CM

## 2017-11-25 DIAGNOSIS — A6000 Herpesviral infection of urogenital system, unspecified: Secondary | ICD-10-CM

## 2017-11-25 DIAGNOSIS — F419 Anxiety disorder, unspecified: Secondary | ICD-10-CM

## 2017-11-25 DIAGNOSIS — F329 Major depressive disorder, single episode, unspecified: Secondary | ICD-10-CM

## 2017-11-25 DIAGNOSIS — R12 Heartburn: Secondary | ICD-10-CM

## 2017-12-09 ENCOUNTER — Other Ambulatory Visit: Payer: Self-pay | Admitting: Physician Assistant

## 2017-12-09 DIAGNOSIS — J4520 Mild intermittent asthma, uncomplicated: Secondary | ICD-10-CM

## 2017-12-23 ENCOUNTER — Other Ambulatory Visit: Payer: Self-pay | Admitting: Physician Assistant

## 2017-12-23 DIAGNOSIS — F32A Depression, unspecified: Secondary | ICD-10-CM

## 2017-12-23 DIAGNOSIS — F329 Major depressive disorder, single episode, unspecified: Secondary | ICD-10-CM

## 2017-12-23 DIAGNOSIS — F419 Anxiety disorder, unspecified: Secondary | ICD-10-CM

## 2017-12-23 DIAGNOSIS — R12 Heartburn: Secondary | ICD-10-CM

## 2018-01-17 ENCOUNTER — Other Ambulatory Visit: Payer: Self-pay

## 2018-01-17 ENCOUNTER — Ambulatory Visit (INDEPENDENT_AMBULATORY_CARE_PROVIDER_SITE_OTHER): Payer: Commercial Managed Care - PPO | Admitting: Physician Assistant

## 2018-01-17 ENCOUNTER — Encounter: Payer: Self-pay | Admitting: Physician Assistant

## 2018-01-17 VITALS — BP 100/70 | HR 82 | Temp 98.7°F | Resp 18 | Ht <= 58 in | Wt 149.6 lb

## 2018-01-17 DIAGNOSIS — R12 Heartburn: Secondary | ICD-10-CM | POA: Diagnosis not present

## 2018-01-17 DIAGNOSIS — F419 Anxiety disorder, unspecified: Secondary | ICD-10-CM

## 2018-01-17 DIAGNOSIS — R87810 Cervical high risk human papillomavirus (HPV) DNA test positive: Secondary | ICD-10-CM

## 2018-01-17 DIAGNOSIS — A6000 Herpesviral infection of urogenital system, unspecified: Secondary | ICD-10-CM | POA: Diagnosis not present

## 2018-01-17 DIAGNOSIS — J4520 Mild intermittent asthma, uncomplicated: Secondary | ICD-10-CM | POA: Diagnosis not present

## 2018-01-17 DIAGNOSIS — Z1211 Encounter for screening for malignant neoplasm of colon: Secondary | ICD-10-CM

## 2018-01-17 DIAGNOSIS — Z0000 Encounter for general adult medical examination without abnormal findings: Secondary | ICD-10-CM | POA: Diagnosis not present

## 2018-01-17 DIAGNOSIS — F32A Depression, unspecified: Secondary | ICD-10-CM

## 2018-01-17 DIAGNOSIS — R238 Other skin changes: Secondary | ICD-10-CM | POA: Diagnosis not present

## 2018-01-17 DIAGNOSIS — F329 Major depressive disorder, single episode, unspecified: Secondary | ICD-10-CM

## 2018-01-17 MED ORDER — ESCITALOPRAM OXALATE 20 MG PO TABS: 20.0000 mg | ORAL_TABLET | Freq: Every day | ORAL | 4 refills | Status: DC

## 2018-01-17 MED ORDER — PANTOPRAZOLE SODIUM 40 MG PO TBEC: 40.0000 mg | DELAYED_RELEASE_TABLET | Freq: Every day | ORAL | 4 refills | Status: DC

## 2018-01-17 MED ORDER — FLUTICASONE-SALMETEROL 100-50 MCG/DOSE IN AEPB: 1.0000 | INHALATION_SPRAY | Freq: Two times a day (BID) | RESPIRATORY_TRACT | 4 refills | Status: DC

## 2018-01-17 MED ORDER — ALBUTEROL SULFATE HFA 108 (90 BASE) MCG/ACT IN AERS: 2.0000 | INHALATION_SPRAY | Freq: Four times a day (QID) | RESPIRATORY_TRACT | 0 refills | Status: DC | PRN

## 2018-01-17 MED ORDER — VALACYCLOVIR HCL 500 MG PO TABS: ORAL_TABLET | ORAL | 4 refills | Status: DC

## 2018-01-17 MED ORDER — LORAZEPAM 1 MG PO TABS: 1.0000 mg | ORAL_TABLET | Freq: Every day | ORAL | 0 refills | Status: DC | PRN

## 2018-01-17 NOTE — Patient Instructions (Addendum)
In order for your supplemental calcium to be effective.  Please take calcium either on an empty stomach or with food that does not contain calcium.  Your body is able only to absorb '500mg'$  of Calcium at a time from any one source.  Do not take with a MVI with iron because iron does not allow th calcium to be absorbed.  Please take Calcium citrate '500mg'$  2-3x/day.  This supplement should have Vit D in it and if your pills do not please add addition Vit D 400 IU with each dose.  calcium content of some foods  calcium-fortified orange juice - 330-350 mg  calcium-fortified breakfast cereals (1 serving) - 200-400 mg  cheese (1 ounce) - 156-314 mg, depending on variety  cottage cheese, 1% milkfat (1 cup) - 138 mg  milk (1 cup) - nonfat 306 mg, low-fat 290 mg, whole 276 mg, soy milk 93 mg (368 mg if calcium-fortified)       IF you received an x-ray today, you will receive an invoice from [facility]. Please contact [facility] at [phone] with questions or concerns regarding your invoice.   IF you received labwork today, you will receive an invoice from [place]. Please contact [facility] at [phone] with questions or concerns regarding your invoice.   Our billing staff will not be able to assist you with questions regarding bills from these companies.  You will be contacted with the lab results as soon as they are available. The fastest way to get your results is to activate your My Chart account. Instructions are located on the last page of this paperwork. If you have not heard from [place] regarding the results in 2 weeks, please contact this office.    Health Maintenance, Female Adopting a healthy lifestyle and getting preventive care can go a long way to promote health and wellness. Talk with your health care provider about what schedule of regular examinations is right for you. This is a good chance for you to check in with your provider about disease prevention and staying  healthy. In between checkups, there are plenty of things you can do on your own. Experts have done a lot of research about which lifestyle changes and preventive measures are most likely to keep you healthy. Ask your health care provider for more information. Weight and diet Eat a healthy diet  Be sure to include plenty of vegetables, fruits, low-fat dairy products, and lean protein.  Do not eat a lot of foods high in solid fats, added sugars, or salt.  Get regular exercise. This is one of the most important things you can do for your health. ? Most adults should exercise for at least 150 minutes each week. The exercise should increase your heart rate and make you sweat (moderate-intensity exercise). ? Most adults should also do strengthening exercises at least twice a week. This is in addition to the moderate-intensity exercise.  Maintain a healthy weight  Body mass index (BMI) is a measurement that can be used to identify possible weight problems. It estimates body fat based on height and weight. Your health care provider can help determine your BMI and help you achieve or maintain a healthy weight.  For females 24 years of age and older: ? A BMI below 18.5 is considered underweight. ? A BMI of 18.5 to 24.9 is normal. ? A BMI of 25 to 29.9 is considered overweight. ? A BMI of 30 and above is considered obese.  Watch levels of cholesterol and blood  lipids  You should start having your blood tested for lipids and cholesterol at 50 years of age, then have this test every 5 years.  You may need to have your cholesterol levels checked more often if: ? Your lipid or cholesterol levels are high. ? You are older than 50 years of age. ? You are at high risk for heart disease.  Cancer screening Lung Cancer  Lung cancer screening is recommended for adults 52-38 years old who are at high risk for lung cancer because of a history of smoking.  A yearly low-dose CT scan of the lungs is  recommended for people who: ? Currently smoke. ? Have quit within the past 15 years. ? Have at least a 30-pack-year history of smoking. A pack year is smoking an average of one pack of cigarettes a day for 1 year.  Yearly screening should continue until it has been 15 years since you quit.  Yearly screening should stop if you develop a health problem that would prevent you from having lung cancer treatment.  Breast Cancer  Practice breast self-awareness. This means understanding how your breasts normally appear and feel.  It also means doing regular breast self-exams. Let your health care provider know about any changes, no matter how small.  If you are in your 20s or 30s, you should have a clinical breast exam (CBE) by a health care provider every 1-3 years as part of a regular health exam.  If you are 24 or older, have a CBE every year. Also consider having a breast X-ray (mammogram) every year.  If you have a family history of breast cancer, talk to your health care provider about genetic screening.  If you are at high risk for breast cancer, talk to your health care provider about having an MRI and a mammogram every year.  Breast cancer gene (BRCA) assessment is recommended for women who have family members with BRCA-related cancers. BRCA-related cancers include: ? Breast. ? Ovarian. ? Tubal. ? Peritoneal cancers.  Results of the assessment will determine the need for genetic counseling and BRCA1 and BRCA2 testing.  Cervical Cancer Your health care provider may recommend that you be screened regularly for cancer of the pelvic organs (ovaries, uterus, and vagina). This screening involves a pelvic examination, including checking for microscopic changes to the surface of your cervix (Pap test). You may be encouraged to have this screening done every 3 years, beginning at age 14.  For women ages 77-65, health care providers may recommend pelvic exams and Pap testing every 3 years,  or they may recommend the Pap and pelvic exam, combined with testing for human papilloma virus (HPV), every 5 years. Some types of HPV increase your risk of cervical cancer. Testing for HPV may also be done on women of any age with unclear Pap test results.  Other health care providers may not recommend any screening for nonpregnant women who are considered low risk for pelvic cancer and who do not have symptoms. Ask your health care provider if a screening pelvic exam is right for you.  If you have had past treatment for cervical cancer or a condition that could lead to cancer, you need Pap tests and screening for cancer for at least 20 years after your treatment. If Pap tests have been discontinued, your risk factors (such as having a new sexual partner) need to be reassessed to determine if screening should resume. Some women have medical problems that increase the chance of getting cervical cancer.  In these cases, your health care provider may recommend more frequent screening and Pap tests.  Colorectal Cancer  This type of cancer can be detected and often prevented.  Routine colorectal cancer screening usually begins at 50 years of age and continues through 50 years of age.  Your health care provider may recommend screening at an earlier age if you have risk factors for colon cancer.  Your health care provider may also recommend using home test kits to check for hidden blood in the stool.  A small camera at the end of a tube can be used to examine your colon directly (sigmoidoscopy or colonoscopy). This is done to check for the earliest forms of colorectal cancer.  Routine screening usually begins at age 33.  Direct examination of the colon should be repeated every 5-10 years through 50 years of age. However, you may need to be screened more often if early forms of precancerous polyps or small growths are found.  Skin Cancer  Check your skin from head to toe regularly.  Tell your  health care provider about any new moles or changes in moles, especially if there is a change in a mole's shape or color.  Also tell your health care provider if you have a mole that is larger than the size of a pencil eraser.  Always use sunscreen. Apply sunscreen liberally and repeatedly throughout the day.  Protect yourself by wearing long sleeves, pants, a wide-brimmed hat, and sunglasses whenever you are outside.  Heart disease, diabetes, and high blood pressure  High blood pressure causes heart disease and increases the risk of stroke. High blood pressure is more likely to develop in: ? People who have blood pressure in the high end of the normal range (130-139/85-89 mm Hg). ? People who are overweight or obese. ? People who are African American.  If you are 45-18 years of age, have your blood pressure checked every 3-5 years. If you are 55 years of age or older, have your blood pressure checked every year. You should have your blood pressure measured twice-once when you are at a hospital or clinic, and once when you are not at a hospital or clinic. Record the average of the two measurements. To check your blood pressure when you are not at a hospital or clinic, you can use: ? An automated blood pressure machine at a pharmacy. ? A home blood pressure monitor.  If you are between 37 years and 71 years old, ask your health care provider if you should take aspirin to prevent strokes.  Have regular diabetes screenings. This involves taking a blood sample to check your fasting blood sugar level. ? If you are at a normal weight and have a low risk for diabetes, have this test once every three years after 50 years of age. ? If you are overweight and have a high risk for diabetes, consider being tested at a younger age or more often. Preventing infection Hepatitis B  If you have a higher risk for hepatitis B, you should be screened for this virus. You are considered at high risk for  hepatitis B if: ? You were born in a country where hepatitis B is common. Ask your health care provider which countries are considered high risk. ? Your parents were born in a high-risk country, and you have not been immunized against hepatitis B (hepatitis B vaccine). ? You have HIV or AIDS. ? You use needles to inject street drugs. ? You live with someone  who has hepatitis B. ? You have had sex with someone who has hepatitis B. ? You get hemodialysis treatment. ? You take certain medicines for conditions, including cancer, organ transplantation, and autoimmune conditions.  Hepatitis C  Blood testing is recommended for: ? Everyone born from [birth year] through 1963. ? Anyone with known risk factors for hepatitis C.  Sexually transmitted infections (STIs)  You should be screened for sexually transmitted infections (STIs) including gonorrhea and chlamydia if: ? You are sexually active and are younger than 50 years of age. ? You are older than 50 years of age and your health care provider tells you that you are at risk for this type of infection. ? Your sexual activity has changed since you were last screened and you are at an increased risk for chlamydia or gonorrhea. Ask your health care provider if you are at risk.  If you do not have HIV, but are at risk, it may be recommended that you take a prescription medicine daily to prevent HIV infection. This is called pre-exposure prophylaxis (PrEP). You are considered at risk if: ? You are sexually active and do not regularly use condoms or know the HIV status of your partner(s). ? You take drugs by injection. ? You are sexually active with a partner who has HIV.  Talk with your health care provider about whether you are at high risk of being infected with HIV. If you choose to begin PrEP, you should first be tested for HIV. You should then be tested every 3 months for as long as you are taking PrEP. Pregnancy  If you are premenopausal and you may  become pregnant, ask your health care provider about preconception counseling.  If you may become pregnant, take 400 to 800 micrograms (mcg) of folic acid every day.  If you want to prevent pregnancy, talk to your health care provider about birth control (contraception). Osteoporosis and menopause  Osteoporosis is a disease in which the bones lose minerals and strength with aging. This can result in serious bone fractures. Your risk for osteoporosis can be identified using a bone density scan.  If you are 13 years of age or older, or if you are at risk for osteoporosis and fractures, ask your health care provider if you should be screened.  Ask your health care provider whether you should take a calcium or vitamin D supplement to lower your risk for osteoporosis.  Menopause may have certain physical symptoms and risks.  Hormone replacement therapy may reduce some of these symptoms and risks. Talk to your health care provider about whether hormone replacement therapy is right for you. Follow these instructions at home:  Schedule regular health, dental, and eye exams.  Stay current with your immunizations.  Do not use any tobacco products including cigarettes, chewing tobacco, or electronic cigarettes.  If you are pregnant, do not drink alcohol.  If you are breastfeeding, limit how much and how often you drink alcohol.  Limit alcohol intake to no more than 1 drink per day for nonpregnant women. One drink equals 12 ounces of beer, 5 ounces of wine, or 1 ounces of hard liquor.  Do not use street drugs.  Do not share needles.  Ask your health care provider for help if you need support or information about quitting drugs.  Tell your health care provider if you often feel depressed.  Tell your health care provider if you have ever been abused or do not feel safe at home. This information is  not intended to replace advice given to you by your health care provider. Make sure you  discuss any questions you have with your health care provider. Document Released: 02/12/2011 Document Revised: 01/05/2016 Document Reviewed: 05/03/2015 Elsevier Interactive Patient Education  [employer].

## 2018-01-17 NOTE — Progress Notes (Signed)
Kathleen Craig  MRN: [id number] DOB: 02/14/1966  PCP: Mancel Bale, PA-C   Chief Complaint  Patient presents with  . Annual Exam    Subjective:  Pt presents to clinic for a CPE.  She has no concerns.  Last dental exam: every 6 months  Last vision exam: about a year ago - reading glasses Last pap: 08/2017 - normal Last mammo: 3/18 - need to make an appt Last colonoscopy: never Vaccinations - UTD  Pt gets some itching on her shave area of her bikini line - only happens in the summer and only when she gets sweaty -- she gets no skin changes.  Typical meals for patient: 2 meals, rare snack - packed lunches, 50% home cooked -  Typical beverage choices: water, trying to decrease sweet tea Exercises:no - sedentary job Sleeps: 7 hrs per night and sleeping well   Patient Active Problem List   Diagnosis Date Noted  . Vitamin D deficiency 08/22/2017  . Cervical high risk HPV (human papillomavirus) test positive 10/16/2016  . History of osteopenia 10/16/2016  . H/O basal cell carcinoma excision 05/26/2015  . Heartburn 03/23/2015  . Anxiety and depression 03/23/2015  . Asthma, mild intermittent, well-controlled 03/23/2015  . Genital herpes 03/23/2015    Patient Care Team: Mittie Bodo as PCP - General (Physician Assistant) Salvadore Dom, MD as Consulting Physician (Obstetrics and Gynecology)  Review of Systems  Constitutional: Negative.   HENT: Negative.   Eyes: Negative.   Respiratory: Negative.        Uses less than 1 albuterol inhaler a year while she is taking her Advair.  Cardiovascular: Negative.   Gastrointestinal: Negative.   Endocrine: Negative.   Genitourinary: Negative.   Musculoskeletal: Negative.   Skin: Negative.   Allergic/Immunologic: Negative.   Neurological: Negative.   Hematological: Negative.   Psychiatric/Behavioral: Negative.      Current Outpatient Medications on File Prior to Visit  Medication Sig Dispense Refill  . metaxalone  (SKELAXIN) 800 MG tablet Take 0.5-1 tablets (400-800 mg total) by mouth 3 (three) times daily. 90 tablet 1  . Multiple Vitamin (MULTIVITAMIN) capsule Take 1 capsule by mouth daily.     No current facility-administered medications on file prior to visit.     Allergies  Allergen Reactions  . Sulfur Hives  . Metronidazole Hives    Social History   Socioeconomic History  . Marital status: Soil scientist    Spouse name: Not on file  . Number of children: Not on file  . Years of education: Not on file  . Highest education level: Not on file  Occupational History  . Occupation: accounting    Comment: [employer]  Social Needs  . Financial resource strain: Not on file  . Food insecurity:    Worry: Not on file    Inability: Not on file  . Transportation needs:    Medical: Not on file    Non-medical: Not on file  Tobacco Use  . Smoking status: Never Smoker  . Smokeless tobacco: Never Used  Substance and Sexual Activity  . Alcohol use: Yes    Alcohol/week: 0.6 - 1.8 oz    Types: 1 - 3 Glasses of wine per week    Comment: 2  . Drug use: No  . Sexual activity: Yes    Comment: partner w/ vasectomy  Lifestyle  . Physical activity:    Days per week: Not on file    Minutes per session: Not on file  .  Stress: Not on file  Relationships  . Social connections:    Talks on phone: Not on file    Gets together: Not on file    Attends religious service: Not on file    Active member of club or organization: Not on file    Attends meetings of clubs or organizations: Not on file    Relationship status: Not on file  Other Topics Concern  . Not on file  Social History Narrative   Married   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 [employer]   Exercise - no   Diet - try to eat healthy - could make some changes    Past Surgical History:  Procedure Laterality Date  . CERVICAL BIOPSY  W/ LOOP ELECTRODE EXCISION  2000   in [place]    Family History  Problem Relation Age of Onset  . Heart  disease Maternal Grandfather   . Hypertension Maternal Grandfather   . Diabetes Mother        diet controlled  . Hyperlipidemia Mother   . Hypertension Mother   . Hyperlipidemia Father   . Alzheimer's disease Paternal Grandfather      Objective:  BP 100/70   Pulse 82   Temp 98.7 F (37.1 C) (Oral)   Resp 18   Ht 4\' 9"  (1.448 m)   Wt 149 lb 9.6 oz (67.9 kg)   LMP 01/13/2018   SpO2 96%   BMI 32.37 kg/m   Physical Exam  Constitutional: She is oriented to person, place, and time. She appears well-developed and well-nourished.  HENT:  Head: Normocephalic and atraumatic.  Right Ear: Hearing, tympanic membrane, external ear and ear canal normal.  Left Ear: Hearing, tympanic membrane, external ear and ear canal normal.  Nose: Nose normal.  Mouth/Throat: Uvula is midline, oropharynx is clear and moist and mucous membranes are normal.  Eyes: Pupils are equal, round, and reactive to light. Conjunctivae, EOM and lids are normal. Right eye exhibits no discharge. Left eye exhibits no discharge.  Neck: Trachea normal and normal range of motion. Neck supple. No thyroid mass and no thyromegaly present.  Cardiovascular: Normal rate, regular rhythm and normal heart sounds.  No murmur heard. Pulmonary/Chest: Effort normal and breath sounds normal. She has no wheezes.  Abdominal: Soft. Normal appearance and bowel sounds are normal. There is no tenderness.  Musculoskeletal: Normal range of motion.  Lymphadenopathy:       Head (right side): No tonsillar, no preauricular, no posterior auricular and no occipital adenopathy present.       Head (left side): No tonsillar, no preauricular, no posterior auricular and no occipital adenopathy present.    She has no cervical adenopathy.       Right: No supraclavicular adenopathy present.       Left: No supraclavicular adenopathy present.  Neurological: She is alert and oriented to person, place, and time. She has normal strength and normal reflexes.    Skin: Skin is warm, dry and intact.  Psychiatric: She has a normal mood and affect. Her speech is normal and behavior is normal. Judgment and thought content normal.  Vitals reviewed.   Wt Readings from Last 3 Encounters:  01/17/18 149 lb 9.6 oz (67.9 kg)  08/22/17 152 lb 9.6 oz (69.2 kg)  07/06/17 152 lb (68.9 kg)     Visual Acuity Screening   Right eye Left eye Both eyes  Without correction: 20/20-2 20/20-1 20/15-2  With correction:       Assessment and Plan :  Annual physical exam  Screen for colon cancer - Plan: Cologuard  Cervical high risk HPV (human papillomavirus) test positive - neg this year - continue care with GYN as directed by them  Genital herpes simplex, unspecified site - Plan: valACYclovir (VALTREX) 500 MG tablet - continue daily medication  Heartburn - Plan: pantoprazole (PROTONIX) 40 MG tablet - continue daily medication  Anxiety and depression - Plan: escitalopram (LEXAPRO) 20 MG tablet, LORazepam (ATIVAN) 1 MG tablet - continue daily medication - ativan for rare prn use  Asthma, mild intermittent, well-controlled - Plan: Fluticasone-Salmeterol (ADVAIR DISKUS) 100-50 MCG/DOSE AEPB, albuterol (PROVENTIL HFA;VENTOLIN HFA) 108 (90 Base) MCG/ACT inhaler - well controlled  Skin irritation - suggested she use OTC hydrocortisone cream to hep with skin irritation after shaving - it is ok to use a gold bond powder also to help with the itching.  Windell Hummingbird PA-C  [facility] 01/17/2018 10:09 AM

## 2018-04-04 IMAGING — MG DIGITAL SCREENING BILATERAL MAMMOGRAM WITH CAD
5 series · 5 of 5 positions shown · non-contrast
Comparison: Previous exam(s).

CLINICAL DATA: Screening.

EXAM:
DIGITAL SCREENING BILATERAL MAMMOGRAM WITH CAD

[R MLO (1 of 2)]
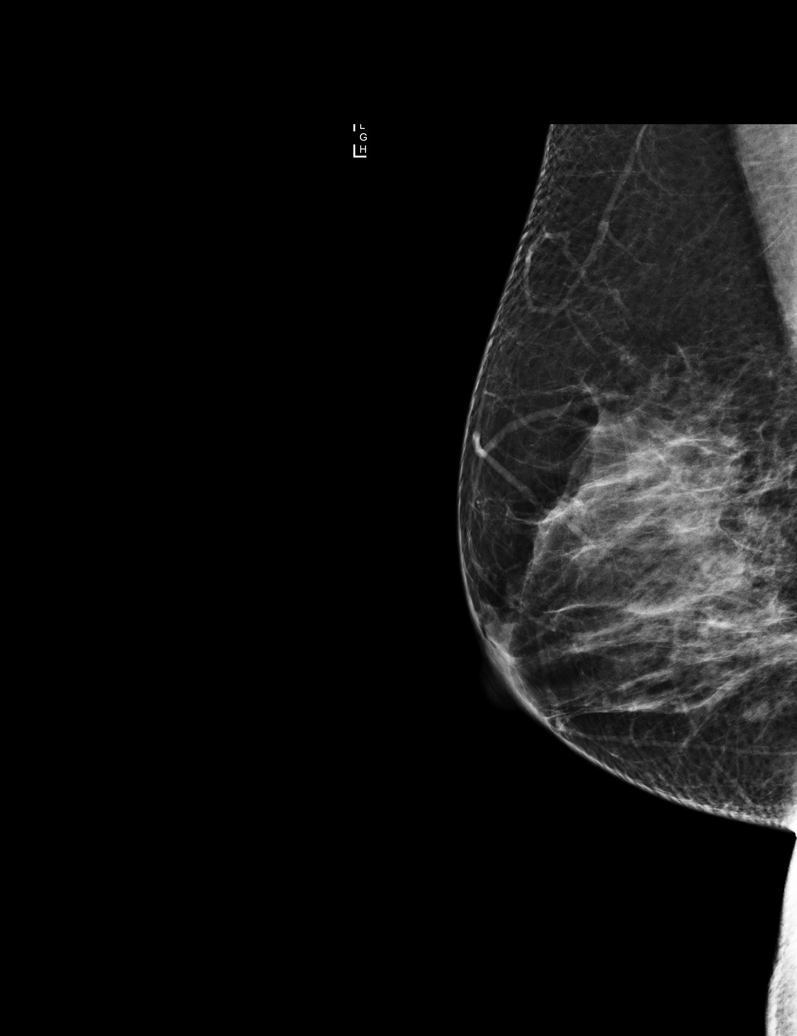

[R MLO (2 of 2)]
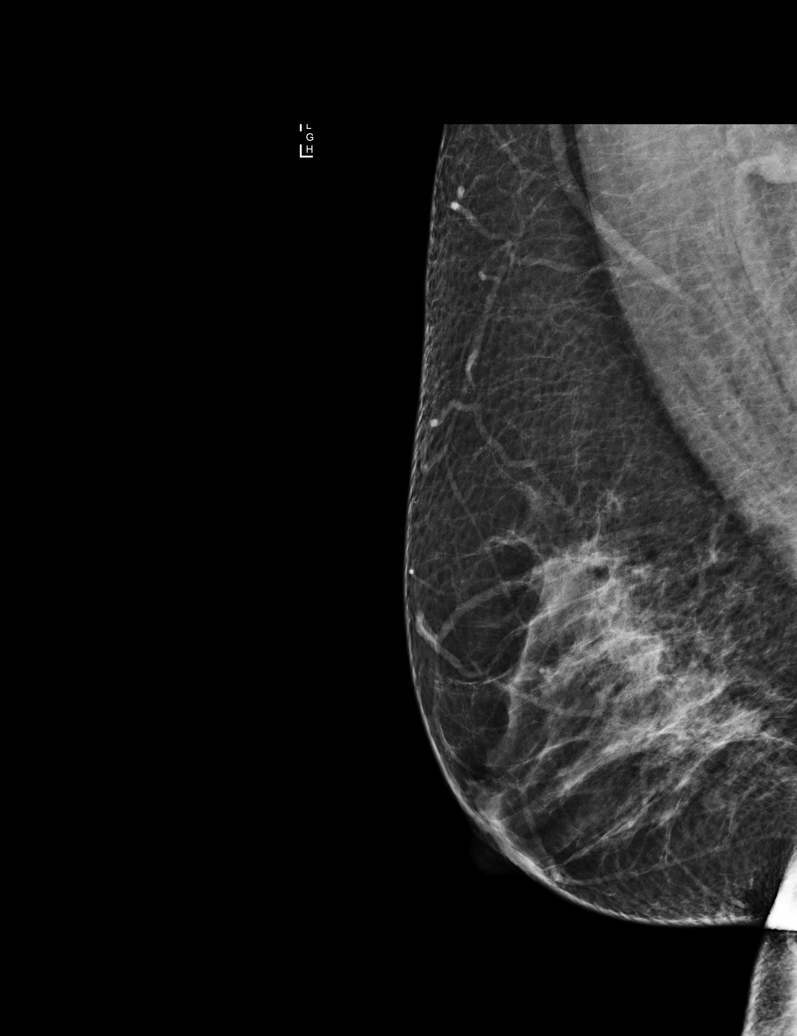

[L MLO]
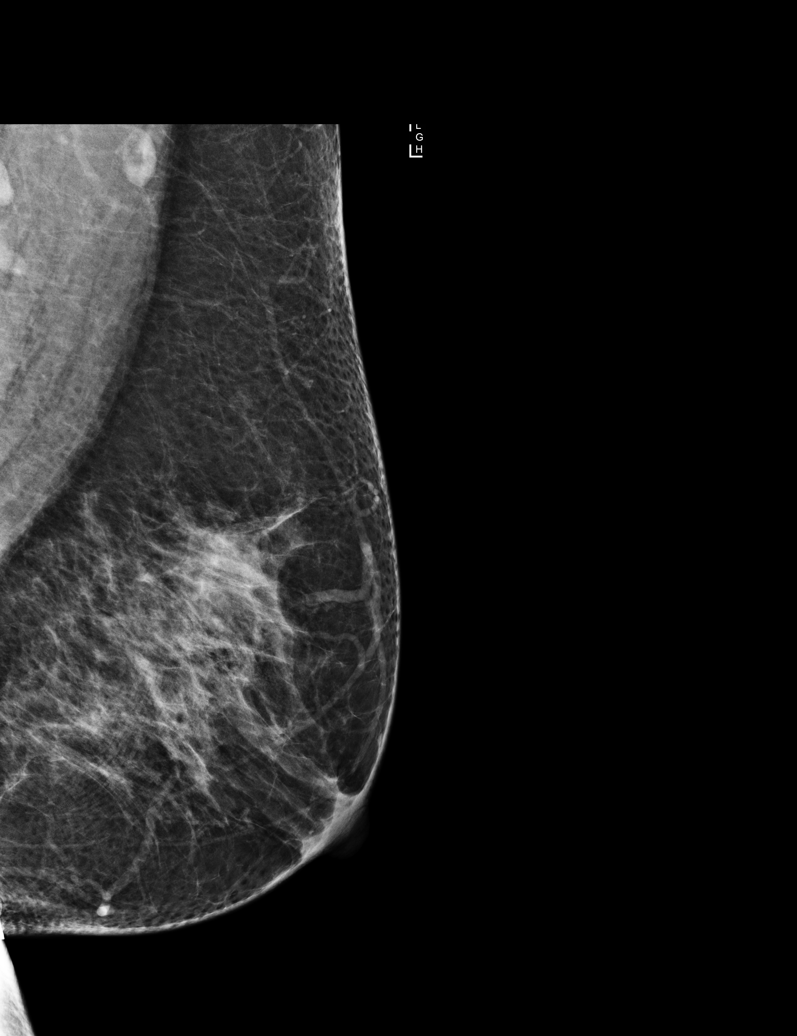

[L CC]
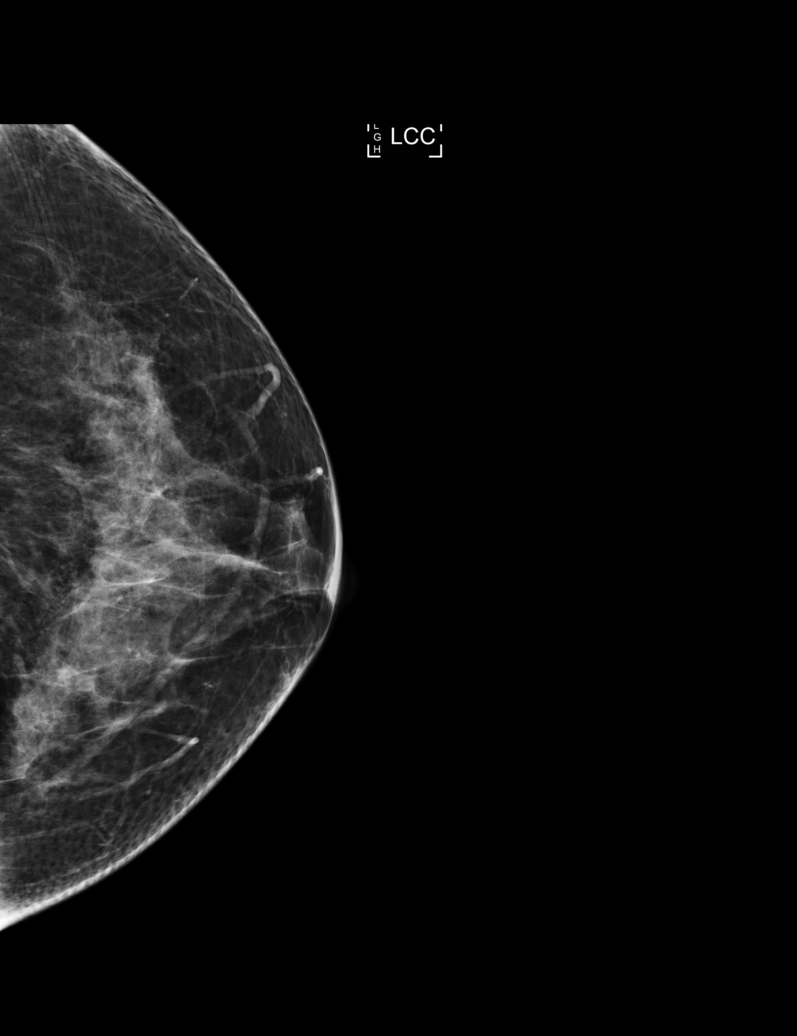

[R CC]
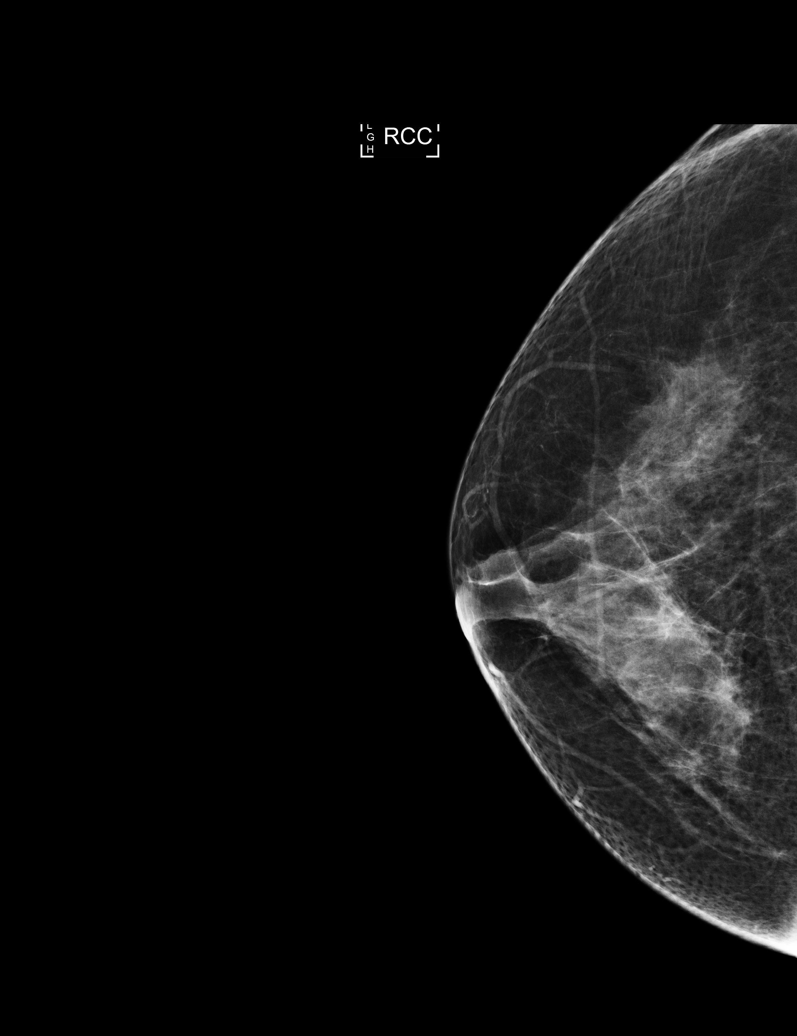

[5 of 5 positions shown; findings below may reference images not displayed]

ACR Breast Density Category c: The breast tissue is heterogeneously
dense, which may obscure small masses.
FINDINGS: There are no findings suspicious for malignancy. Images were
processed with CAD.
IMPRESSION: No mammographic evidence of malignancy. A result letter of this
screening mammogram will be mailed directly to the patient.

RECOMMENDATION:
Screening mammogram in one year. (Code:YJ-[id number])

BI-RADS CATEGORY  1: Negative.

## 2018-05-22 ENCOUNTER — Ambulatory Visit (INDEPENDENT_AMBULATORY_CARE_PROVIDER_SITE_OTHER): Payer: Commercial Managed Care - PPO

## 2018-05-22 ENCOUNTER — Ambulatory Visit: Payer: Commercial Managed Care - PPO | Admitting: Physician Assistant

## 2018-05-22 ENCOUNTER — Other Ambulatory Visit: Payer: Self-pay

## 2018-05-22 ENCOUNTER — Encounter: Payer: Self-pay | Admitting: Physician Assistant

## 2018-05-22 VITALS — BP 118/77 | HR 70 | Temp 98.4°F | Resp 16 | Ht <= 58 in | Wt 150.6 lb

## 2018-05-22 DIAGNOSIS — M791 Myalgia, unspecified site: Secondary | ICD-10-CM | POA: Diagnosis not present

## 2018-05-22 DIAGNOSIS — M25561 Pain in right knee: Secondary | ICD-10-CM | POA: Diagnosis not present

## 2018-05-22 MED ORDER — TROLAMINE SALICYLATE 10 % EX CREA: 1.0000 "application " | TOPICAL_CREAM | CUTANEOUS | 0 refills | Status: DC | PRN

## 2018-05-22 NOTE — Progress Notes (Signed)
Kathleen Craig  MRN: [id number] DOB: 07/26/1966  PCP: Patient, No Pcp Per  Subjective:  Pt is a 52 year old female who presents to clinic for right leg pain.  Experiences 3/10 discomfort with occasional "burning" pain of medial upper knee. Present when she first wakes in the morning and gradually improves over the next 3-4 hours.  Does not happen every night.  Episodes have been happening more frequently over the past week or 2. Pain does not radiate.  She has not made any lifestyle changes in the past few months and cannot recall any repetitive movements which would bring about her pain.  No MOI.  Denies swelling, redness, warmth, claudication, decreased ROM, joint pain or swelling, fever, chills, bruising, muscle weakness, nunmbness.   Takes ibuprofen for pain, which helps.   Review of Systems  Constitutional: Negative for chills and fever.  Cardiovascular: Negative for chest pain, palpitations and leg swelling.  Musculoskeletal: Positive for myalgias. Negative for arthralgias, gait problem and joint swelling.  Skin: Negative.   Neurological: Negative for weakness and numbness.    Patient Active Problem List   Diagnosis Date Noted  . Vitamin D deficiency 08/22/2017  . Cervical high risk HPV (human papillomavirus) test positive 10/16/2016  . History of osteopenia 10/16/2016  . H/O basal cell carcinoma excision 05/26/2015  . Heartburn 03/23/2015  . Anxiety and depression 03/23/2015  . Asthma, mild intermittent, well-controlled 03/23/2015  . Genital herpes 03/23/2015    Current Outpatient Medications on File Prior to Visit  Medication Sig Dispense Refill  . albuterol (PROVENTIL HFA;VENTOLIN HFA) 108 (90 Base) MCG/ACT inhaler Inhale 2 puffs into the lungs every 6 (six) hours as needed for wheezing or shortness of breath. 1 Inhaler 0  . escitalopram (LEXAPRO) 20 MG tablet Take 1 tablet (20 mg total) by mouth daily. 90 tablet 4  . Fluticasone-Salmeterol (ADVAIR DISKUS) 100-50  MCG/DOSE AEPB Inhale 1 puff into the lungs 2 (two) times daily. 180 each 4  . LORazepam (ATIVAN) 1 MG tablet Take 1 tablet (1 mg total) by mouth daily as needed for anxiety. 20 tablet 0  . metaxalone (SKELAXIN) 800 MG tablet Take 0.5-1 tablets (400-800 mg total) by mouth 3 (three) times daily. 90 tablet 1  . Multiple Vitamin (MULTIVITAMIN) capsule Take 1 capsule by mouth daily.    . pantoprazole (PROTONIX) 40 MG tablet Take 1 tablet (40 mg total) by mouth daily. 90 tablet 4  . valACYclovir (VALTREX) 500 MG tablet TAKE 1 TABLET BY MOUTH EVERY DAY, INCREASE TO 1 TABLET TWICE DAILY FOR 3 DAYS FOR OUTBREAK 90 tablet 4   No current facility-administered medications on file prior to visit.     Allergies  Allergen Reactions  . Sulfur Hives  . Metronidazole Hives     Objective:  BP 118/77 (BP Location: Right Arm, Patient Position: Sitting, Cuff Size: Large)   Pulse 70   Temp 98.4 F (36.9 C) (Oral)   Resp 16   Ht 4' 9.5" (1.461 m)   Wt 150 lb 9.6 oz (68.3 kg)   LMP 05/08/2018   SpO2 95%   BMI 32.03 kg/m   Physical Exam  Constitutional: She appears well-developed and well-nourished.  Musculoskeletal:       Right hip: She exhibits normal range of motion, normal strength, no tenderness and no bony tenderness.       Right knee: She exhibits normal range of motion, no swelling, no effusion, no deformity, normal alignment, normal patellar mobility and no bony tenderness. No tenderness found.  Legs: No size discrepancy of thighs or calves. Full range of motion of knee and hip Mild tenderness to palpation medial distal quadriceps.   Psychiatric: She has a normal mood and affect. Her behavior is normal. Judgment and thought content normal.  Vitals reviewed.  Dg Knee Complete 4 Views Right  Result Date: 05/22/2018 CLINICAL DATA:  Medial burning knee pain for 2 months. No known injury. EXAM: RIGHT KNEE - COMPLETE 4+ VIEW COMPARISON:  None. FINDINGS: No evidence of fracture, dislocation,  or joint effusion. No evidence of arthropathy or other focal bone abnormality. Soft tissues are unremarkable. IMPRESSION: Negative. Electronically Signed   By: Rolm Baptise M.D.   On: 05/22/2018 13:10    Assessment and Plan :  1. Muscle pain -Patient endorses discomfort of medial distal quadriceps for the past 2 months.  No red flags.  HPI and physical exam are not suggestive of DVT.  Negative x-ray.  Unimpressive physical exam.  Plan to treat supportively for muscle pain, possibly myofascial pain.  Advised heat, stretching.  If no improvement after 2 to 3 weeks consider sports medicine or physical therapy referral.  She agrees with plan. - DG Knee Complete 4 Views Right; Future - trolamine salicylate (ASPERCREME/ALOE) 10 % cream; Apply 1 application topically as needed for muscle pain.  Dispense: 85 g; Refill: 0   Whitney Myron Stankovich, PA-C  [facility] [place] 05/22/2018 12:21 PM  Please note: Portions of this report may have been transcribed using dragon voice recognition software. Every effort was made to ensure accuracy; however, inadvertent computerized transcription errors may be present.

## 2018-05-22 NOTE — Patient Instructions (Addendum)
Try your muscle relaxer at night  Apply moist heat to the area. Wet a towel and wring it out so it is damp. Put it in the microwave for about 15-20 seconds - long enough to make it hot, but not too hot to apply to your skin causing burns. Do this for about 20-30 minutes, 3-4 times a day.   Perform gentle, light stretches 2-3 times a day.   Put a tennis ball between your back and a wall. Gentle massage the area with rolling the ball around the affected area. Try a foam roller.   Stay well hydrated - try to drink 32-64 oz/day.   Aspercream may help  If you are not improving, let me know and I will refer you to sports medicine or PT.      Thank you for coming in today. I hope you feel we met your needs.  Feel free to call PCP if you have any questions or further requests.  Please consider signing up for MyChart if you do not already have it, as this is a great way to communicate with me.  Best,  Whitney McVey, PA-C   IF you received an x-ray today, you will receive an invoice from [facility]. Please contact [facility] at [phone] with questions or concerns regarding your invoice.   IF you received labwork today, you will receive an invoice from [place]. Please contact [facility] at [phone] with questions or concerns regarding your invoice.   Our billing staff will not be able to assist you with questions regarding bills from these companies.  You will be contacted with the lab results as soon as they are available. The fastest way to get your results is to activate your My Chart account. Instructions are located on the last page of this paperwork. If you have not heard from [place] regarding the results in 2 weeks, please contact this office.         IF you received an x-ray today, you will receive an invoice from [facility]. Please contact [facility] at [phone] with questions or concerns regarding your invoice.   IF you received  labwork today, you will receive an invoice from [place]. Please contact [facility] at [phone] with questions or concerns regarding your invoice.   Our billing staff will not be able to assist you with questions regarding bills from these companies.  You will be contacted with the lab results as soon as they are available. The fastest way to get your results is to activate your My Chart account. Instructions are located on the last page of this paperwork. If you have not heard from [place] regarding the results in 2 weeks, please contact this office.

## 2018-09-02 ENCOUNTER — Other Ambulatory Visit: Payer: Self-pay | Admitting: Physician Assistant

## 2018-09-02 DIAGNOSIS — Z1231 Encounter for screening mammogram for malignant neoplasm of breast: Secondary | ICD-10-CM

## 2018-09-29 NOTE — Progress Notes (Signed)
53 y.o. [id number] Domestic Partner White or Caucasian Not Hispanic or Latino female here for annual exam.   Her LMP was at the end of September. In the past few years she has occasionally skipped a cycle. Her last cycles came on time, bleed for 4-5 days (up from 3 days), heavier than normal. She was saturating a pad every 2 hours. Cramps were worse than normal. Vasectomy for contraception. No hot flashes, night sweats or vaginal dryness.  She is on daily valtrex, was getting outbreaks all the time, now getting outbreaks ~ every 4 months. No dyspareunia. Same partner x 5 years, live together.   She c/o long term vaginal d/c, intermittent itching and irritation. Currently mildly irritated. No odor.     Patient's last menstrual period was 05/12/2018 (exact date).          Sexually active: Yes.    The current method of family planning is vasectomy.    Exercising: No.  The patient does not participate in regular exercise at present. Smoker:  no  Health Maintenance: Pap:  08/22/2017 WNL NEG HPV, 10/2016 Neg History of abnormal Pap:  Yes, 2000 hx of LEEP--done in [place] MMG:  10/2016 Density C/Neg/BiRads1, scheduled next week Colonoscopy:  n/a BMD:   11-13-16 normal:TBC--Hx Osteopenia and hx of Boniva. She was on prednisone secondary to asthma. Last DEXA was normal.  TDaP:  05-26-15 Gardasil: No   reports that she has never smoked. She has never used smokeless tobacco. She reports current alcohol use. She reports that she does not use drugs. Drinks 0-3 beers a week. She is an Web designer. Lives with her partner. Daughter is 70  Past Medical History:  Diagnosis Date  . Abnormal Pap smear of cervix 2000   Hx of LEEP and paps normal since  . Allergy   . Anxiety   . Cancer ([place]) 2016, 2010   basal cell on head and collarbone  . Depression   . Hx of osteopenia   . STD (sexually transmitted disease)    HSV II    Past Surgical History:  Procedure Laterality Date  . CERVICAL BIOPSY  W/  LOOP ELECTRODE EXCISION  2000   in [place]    Current Outpatient Medications  Medication Sig Dispense Refill  . albuterol (PROVENTIL HFA;VENTOLIN HFA) 108 (90 Base) MCG/ACT inhaler Inhale 2 puffs into the lungs every 6 (six) hours as needed for wheezing or shortness of breath. 1 Inhaler 0  . escitalopram (LEXAPRO) 20 MG tablet Take 1 tablet (20 mg total) by mouth daily. 90 tablet 4  . Fluticasone-Salmeterol (ADVAIR DISKUS) 100-50 MCG/DOSE AEPB Inhale 1 puff into the lungs 2 (two) times daily. 180 each 4  . LORazepam (ATIVAN) 1 MG tablet Take 1 tablet (1 mg total) by mouth daily as needed for anxiety. 20 tablet 0  . metaxalone (SKELAXIN) 800 MG tablet Take 0.5-1 tablets (400-800 mg total) by mouth 3 (three) times daily. 90 tablet 1  . Multiple Vitamin (MULTIVITAMIN) capsule Take 1 capsule by mouth daily.    . pantoprazole (PROTONIX) 40 MG tablet Take 1 tablet (40 mg total) by mouth daily. 90 tablet 4  . trolamine salicylate (ASPERCREME/ALOE) 10 % cream Apply 1 application topically as needed for muscle pain. 85 g 0  . valACYclovir (VALTREX) 500 MG tablet TAKE 1 TABLET BY MOUTH EVERY DAY, INCREASE TO 1 TABLET TWICE DAILY FOR 3 DAYS FOR OUTBREAK 90 tablet 4   No current facility-administered medications for this visit.     Family History  Problem Relation Age of Onset  . Heart disease Maternal Grandfather   . Hypertension Maternal Grandfather   . Diabetes Mother        diet controlled  . Hyperlipidemia Mother   . Hypertension Mother   . Hyperlipidemia Father   . Alzheimer's disease Paternal Grandfather     Review of Systems  Constitutional: Negative.   HENT: Negative.   Eyes: Negative.   Respiratory: Negative.   Cardiovascular: Negative.   Gastrointestinal: Negative.   Endocrine: Negative.   Genitourinary: Negative.   Musculoskeletal: Negative.   Skin: Negative.   Allergic/Immunologic: Negative.   Neurological: Negative.   Hematological: Negative.   Psychiatric/Behavioral:  Negative.     Exam:   BP 116/70 (BP Location: Right Arm, Patient Position: Sitting, Cuff Size: Normal)   Pulse 80   Ht 4\' 10"  (1.473 m)   Wt 154 lb 3.2 oz (69.9 kg)   LMP 05/12/2018 (Exact Date)   BMI 32.23 kg/m   Weight change: @WEIGHTCHANGE @ Height:   Height: 4\' 10"  (147.3 cm)  Ht Readings from Last 3 Encounters:  09/30/18 4\' 10"  (1.473 m)  05/22/18 4' 9.5" (1.461 m)  01/17/18 4\' 9"  (1.448 m)    General appearance: alert, cooperative and appears stated age Head: Normocephalic, without obvious abnormality, atraumatic Neck: no adenopathy, supple, symmetrical, trachea midline and thyroid normal to inspection and palpation Lungs: clear to auscultation bilaterally Cardiovascular: regular rate and rhythm Breasts: normal appearance, no masses or tenderness Abdomen: soft, non-tender; non distended,  no masses,  no organomegaly Extremities: extremities normal, atraumatic, no cyanosis or edema Skin: Skin color, texture, turgor normal. No rashes or lesions Lymph nodes: Cervical, supraclavicular, and axillary nodes normal. No abnormal inguinal nodes palpated Neurologic: Grossly normal   Pelvic: External genitalia:  no lesions, some mild whitening, fissure above clitoris              Urethra:  normal appearing urethra with no masses, tenderness or lesions              Bartholins and Skenes: normal                 Vagina: erythematous appearing vagina with an increase in yellow, frothy vaginal discharge.               Cervix: no lesions               Bimanual Exam:  Uterus:  normal size, contour, position, consistency, mobility, non-tender              Adnexa: no mass, fullness, tenderness               Rectovaginal: Confirms               Anus:  normal sphincter tone, no lesions  Chaperone was present for exam.  Wet prep: + clue, no trich, few wbc KOH: no yeast PH: 5   A:  Well Woman with normal exam  H/O HSV on suppression  BMI 32  FH of DM  Amenorrhea   Vaginal discharge,  vulvar irritation. BV on slides  P:   No pap this year  Screening labs, TSH, FSH, HgbA1C  Discussed weight loss, weight watchers, exercise  Cyclic provera  Discussed breast self exam  Discussed calcium and vit D intake  Steroid ointment  Treat BV with cleocin vaginal cream   Will send affirm to r/o yeast  Cologuard (she will check coverage)

## 2018-09-30 ENCOUNTER — Encounter: Payer: Self-pay | Admitting: Obstetrics and Gynecology

## 2018-09-30 ENCOUNTER — Other Ambulatory Visit: Payer: Self-pay

## 2018-09-30 ENCOUNTER — Ambulatory Visit: Payer: Commercial Managed Care - PPO | Admitting: Obstetrics and Gynecology

## 2018-09-30 VITALS — BP 116/70 | HR 80 | Ht <= 58 in | Wt 154.2 lb

## 2018-09-30 DIAGNOSIS — Z01419 Encounter for gynecological examination (general) (routine) without abnormal findings: Secondary | ICD-10-CM | POA: Diagnosis not present

## 2018-09-30 DIAGNOSIS — N951 Menopausal and female climacteric states: Secondary | ICD-10-CM | POA: Diagnosis not present

## 2018-09-30 DIAGNOSIS — Z1211 Encounter for screening for malignant neoplasm of colon: Secondary | ICD-10-CM | POA: Diagnosis not present

## 2018-09-30 DIAGNOSIS — Z0000 Encounter for general adult medical examination without abnormal findings: Secondary | ICD-10-CM

## 2018-09-30 DIAGNOSIS — N898 Other specified noninflammatory disorders of vagina: Secondary | ICD-10-CM

## 2018-09-30 DIAGNOSIS — A6000 Herpesviral infection of urogenital system, unspecified: Secondary | ICD-10-CM

## 2018-09-30 DIAGNOSIS — Z6832 Body mass index (BMI) 32.0-32.9, adult: Secondary | ICD-10-CM

## 2018-09-30 DIAGNOSIS — Z833 Family history of diabetes mellitus: Secondary | ICD-10-CM | POA: Diagnosis not present

## 2018-09-30 DIAGNOSIS — N912 Amenorrhea, unspecified: Secondary | ICD-10-CM | POA: Diagnosis not present

## 2018-09-30 DIAGNOSIS — N760 Acute vaginitis: Secondary | ICD-10-CM

## 2018-09-30 MED ORDER — VALACYCLOVIR HCL 500 MG PO TABS: ORAL_TABLET | ORAL | 4 refills | Status: DC

## 2018-09-30 MED ORDER — CLINDAMYCIN PHOSPHATE 2 % VA CREA: 1.0000 | TOPICAL_CREAM | Freq: Every day | VAGINAL | 0 refills | Status: AC

## 2018-09-30 MED ORDER — MEDROXYPROGESTERONE ACETATE 5 MG PO TABS: ORAL_TABLET | ORAL | 1 refills | Status: DC

## 2018-09-30 MED ORDER — BETAMETHASONE VALERATE 0.1 % EX OINT: 1.0000 "application " | TOPICAL_OINTMENT | Freq: Two times a day (BID) | CUTANEOUS | 0 refills | Status: DC

## 2018-09-30 NOTE — Patient Instructions (Signed)
EXERCISE AND DIET:  We recommended that you start or continue a regular exercise program for good health. Regular exercise means any activity that makes your heart beat faster and makes you sweat.  We recommend exercising at least 30 minutes per day at least 3 days a week, preferably 4 or 5.  We also recommend a diet low in fat and sugar.  Inactivity, poor dietary choices and obesity can cause diabetes, heart attack, stroke, and kidney damage, among others.    ALCOHOL AND SMOKING:  Women should limit their alcohol intake to no more than 7 drinks/beers/glasses of wine (combined, not each!) per week. Moderation of alcohol intake to this level decreases your risk of breast cancer and liver damage. And of course, no recreational drugs are part of a healthy lifestyle.  And absolutely no smoking or even second hand smoke. Most people know smoking can cause heart and lung diseases, but did you know it also contributes to weakening of your bones? Aging of your skin?  Yellowing of your teeth and nails?  CALCIUM AND VITAMIN D:  Adequate intake of calcium and Vitamin D are recommended.  The recommendations for exact amounts of these supplements seem to change often, but generally speaking 1,200 mg of calcium (between diet and supplement) and 800 units of Vitamin D per day seems prudent. Certain women may benefit from higher intake of Vitamin D.  If you are among these women, your doctor will have told you during your visit.    PAP SMEARS:  Pap smears, to check for cervical cancer or precancers,  have traditionally been done yearly, although recent scientific advances have shown that most women can have pap smears less often.  However, every woman still should have a physical exam from her gynecologist every year. It will include a breast check, inspection of the vulva and vagina to check for abnormal growths or skin changes, a visual exam of the cervix, and then an exam to evaluate the size and shape of the uterus and  ovaries.  And after 51 years of age, a rectal exam is indicated to check for rectal cancers. We will also provide age appropriate advice regarding health maintenance, like when you should have certain vaccines, screening for sexually transmitted diseases, bone density testing, colonoscopy, mammograms, etc.   MAMMOGRAMS:  All women over 40 years old should have a yearly mammogram. Many facilities now offer a "3D" mammogram, which may cost around $50 extra out of pocket. If possible,  we recommend you accept the option to have the 3D mammogram performed.  It both reduces the number of women who will be called back for extra views which then turn out to be normal, and it is better than the routine mammogram at detecting truly abnormal areas.    COLON CANCER SCREENING: Now recommend starting at age 45. At this time colonoscopy is not covered for routine screening until 50. There are take home tests that can be done between 45-49.   COLONOSCOPY:  Colonoscopy to screen for colon cancer is recommended for all women at age 50.  We know, you hate the idea of the prep.  We agree, BUT, having colon cancer and not knowing it is worse!!  Colon cancer so often starts as a polyp that can be seen and removed at colonscopy, which can quite literally save your life!  And if your first colonoscopy is normal and you have no family history of colon cancer, most women don't have to have it again for   10 years.  Once every ten years, you can do something that may end up saving your life, right?  We will be happy to help you get it scheduled when you are ready.  Be sure to check your insurance coverage so you understand how much it will cost.  It may be covered as a preventative service at no cost, but you should check your particular policy.      Breast Self-Awareness Breast self-awareness means being familiar with how your breasts look and feel. It involves checking your breasts regularly and reporting any changes to your  health care provider. Practicing breast self-awareness is important. A change in your breasts can be a sign of a serious medical problem. Being familiar with how your breasts look and feel allows you to find any problems early, when treatment is more likely to be successful. All women should practice breast self-awareness, including women who have had breast implants. How to do a breast self-exam One way to learn what is normal for your breasts and whether your breasts are changing is to do a breast self-exam. To do a breast self-exam: Look for Changes  1. Remove all the clothing above your waist. 2. Stand in front of a mirror in a room with good lighting. 3. Put your hands on your hips. 4. Push your hands firmly downward. 5. Compare your breasts in the mirror. Look for differences between them (asymmetry), such as: ? Differences in shape. ? Differences in size. ? Puckers, dips, and bumps in one breast and not the other. 6. Look at each breast for changes in your skin, such as: ? Redness. ? Scaly areas. 7. Look for changes in your nipples, such as: ? Discharge. ? Bleeding. ? Dimpling. ? Redness. ? A change in position. Feel for Changes Carefully feel your breasts for lumps and changes. It is best to do this while lying on your back on the floor and again while sitting or standing in the shower or tub with soapy water on your skin. Feel each breast in the following way:  Place the arm on the side of the breast you are examining above your head.  Feel your breast with the other hand.  Start in the nipple area and make  inch (2 cm) overlapping circles to feel your breast. Use the pads of your three middle fingers to do this. Apply light pressure, then medium pressure, then firm pressure. The light pressure will allow you to feel the tissue closest to the skin. The medium pressure will allow you to feel the tissue that is a little deeper. The firm pressure will allow you to feel the tissue  close to the ribs.  Continue the overlapping circles, moving downward over the breast until you feel your ribs below your breast.  Move one finger-width toward the center of the body. Continue to use the  inch (2 cm) overlapping circles to feel your breast as you move slowly up toward your collarbone.  Continue the up and down exam using all three pressures until you reach your armpit.  Write Down What You Find  Write down what is normal for each breast and any changes that you find. Keep a written record with breast changes or normal findings for each breast. By writing this information down, you do not need to depend only on memory for size, tenderness, or location. Write down where you are in your menstrual cycle, if you are still menstruating. If you are having trouble noticing differences   in your breasts, do not get discouraged. With time you will become more familiar with the variations in your breasts and more comfortable with the exam. How often should I examine my breasts? Examine your breasts every month. If you are breastfeeding, the best time to examine your breasts is after a feeding or after using a breast pump. If you menstruate, the best time to examine your breasts is 5-7 days after your period is over. During your period, your breasts are lumpier, and it may be more difficult to notice changes. When should I see my health care provider? See your health care provider if you notice:  A change in shape or size of your breasts or nipples.  A change in the skin of your breast or nipples, such as a reddened or scaly area.  Unusual discharge from your nipples.  A lump or thick area that was not there before.  Pain in your breasts.  Anything that concerns you.  

## 2018-10-01 LAB — VAGINITIS/VAGINOSIS, DNA PROBE
Candida Species: NEGATIVE
Gardnerella vaginalis: POSITIVE — AB
Trichomonas vaginosis: NEGATIVE

## 2018-10-01 LAB — CBC
HEMATOCRIT: 34.9 % (ref 34.0–46.6)
HEMOGLOBIN: 10.6 g/dL — AB (ref 11.1–15.9)
MCH: 25.5 pg — ABNORMAL LOW (ref 26.6–33.0)
MCHC: 30.4 g/dL — ABNORMAL LOW (ref 31.5–35.7)
MCV: 84 fL (ref 79–97)
Platelets: 296 10*3/uL (ref 150–450)
RBC: 4.16 x10E6/uL (ref 3.77–5.28)
RDW: 15.2 % (ref 11.7–15.4)
WBC: 9.2 10*3/uL (ref 3.4–10.8)

## 2018-10-01 LAB — LIPID PANEL
Chol/HDL Ratio: 3.8 ratio (ref 0.0–4.4)
Cholesterol, Total: 196 mg/dL (ref 100–199)
HDL: 51 mg/dL (ref >39)
LDL Calculated: 118 mg/dL — ABNORMAL HIGH (ref 0–99)
Triglycerides: 134 mg/dL (ref 0–149)
VLDL Cholesterol Cal: 27 mg/dL (ref 5–40)

## 2018-10-01 LAB — COMPREHENSIVE METABOLIC PANEL
A/G RATIO: 1.3 (ref 1.2–2.2)
ALT: 19 IU/L (ref 0–32)
AST: 17 IU/L (ref 0–40)
Albumin: 4 g/dL (ref 3.8–4.8)
Alkaline Phosphatase: 96 IU/L (ref 39–117)
BUN / CREAT RATIO: 12 (ref 9–23)
BUN: 11 mg/dL (ref 6–24)
Bilirubin Total: 0.2 mg/dL (ref 0.0–1.2)
CO2: 23 mmol/L (ref 20–29)
Calcium: 9.1 mg/dL (ref 8.7–10.2)
Chloride: 105 mmol/L (ref 96–106)
Creatinine, Ser: 0.93 mg/dL (ref 0.57–1.00)
GFR calc Af Amer: 83 mL/min/{1.73_m2} (ref >59)
GFR calc non Af Amer: 72 mL/min/{1.73_m2} (ref >59)
Globulin, Total: 3.2 g/dL (ref 1.5–4.5)
Glucose: 88 mg/dL (ref 65–99)
POTASSIUM: 4.3 mmol/L (ref 3.5–5.2)
Sodium: 143 mmol/L (ref 134–144)
Total Protein: 7.2 g/dL (ref 6.0–8.5)

## 2018-10-01 LAB — HEMOGLOBIN A1C
Est. average glucose Bld gHb Est-mCnc: 123 mg/dL
Hgb A1c MFr Bld: 5.9 % — ABNORMAL HIGH (ref 4.8–5.6)

## 2018-10-01 LAB — FOLLICLE STIMULATING HORMONE: FSH: 7.3 m[IU]/mL

## 2018-10-01 LAB — TSH: TSH: 4.08 u[IU]/mL (ref 0.450–4.500)

## 2018-10-03 ENCOUNTER — Telehealth: Payer: Self-pay | Admitting: *Deleted

## 2018-10-03 DIAGNOSIS — R899 Unspecified abnormal finding in specimens from other organs, systems and tissues: Secondary | ICD-10-CM

## 2018-10-03 DIAGNOSIS — R7303 Prediabetes: Secondary | ICD-10-CM

## 2018-10-03 LAB — SPECIMEN STATUS REPORT

## 2018-10-03 LAB — FERRITIN: Ferritin: 11 ng/mL — ABNORMAL LOW (ref 15–150)

## 2018-10-03 NOTE — Telephone Encounter (Signed)
Message left to return call to Dacen Frayre at [phone].    

## 2018-10-03 NOTE — Telephone Encounter (Signed)
-----   Message from Salvadore Dom, MD sent at 10/01/2018  5:24 PM EST ----- Please inform the patient that her affirm only returned with BV (already being treated) She is mildly anemic (I doubt from her cycles, she has had one since September). Please try and add a ferritin onto her blood work. She should definitely proceed with colon cancer screening to r/o GI bleeding. She has pre-diabetes, please set up with the pre-diabetes clinic.  Let her know that her lipid panel has improved, now with only mildly elevated LDL.  Her TSH is in the upper normal range. FSH in pre-menopausal range. Anticipate she will bleed following the provera she was prescribed.

## 2018-10-03 NOTE — Telephone Encounter (Signed)
Returned call to patient. All results reviewed with patient and she verbalized understanding. Advised patient to start taking iron daily. 2 month lab recheck scheduled for 12-01-2018 at 0900. Patient agreeable to date and time of appointment. Future orders placed for labs. Patient advised referral would be placed to [facility]al Department for pre- diabetes counseling. Patient agreeable. Aware will be contacted to schedule. Patient will contact insurance to see coverage for cologuard and return call to proceed.   Encounter closed.

## 2018-10-03 NOTE — Telephone Encounter (Signed)
-----   Message from Salvadore Dom, MD sent at 10/03/2018 12:57 PM EST ----- Please let the patient know that she does have an iron deficiency. She should take one iron tablet a day (ie ferrex 150 mg) and set her up for a repeat CBC and ferritin in 2 months.

## 2018-10-03 NOTE — Telephone Encounter (Signed)
Patient is returning a call to Emily. °

## 2018-10-06 ENCOUNTER — Ambulatory Visit
Admission: RE | Admit: 2018-10-06 | Discharge: 2018-10-06 | Disposition: A | Discharge status: Home / Self Care | Payer: Commercial Managed Care - PPO | Source: Ambulatory Visit | Attending: Physician Assistant | Admitting: Physician Assistant

## 2018-10-06 DIAGNOSIS — Z1231 Encounter for screening mammogram for malignant neoplasm of breast: Secondary | ICD-10-CM

## 2018-10-16 DIAGNOSIS — H93A1 Pulsatile tinnitus, right ear: Secondary | ICD-10-CM | POA: Insufficient documentation

## 2018-10-16 DIAGNOSIS — H9011 Conductive hearing loss, unilateral, right ear, with unrestricted hearing on the contralateral side: Secondary | ICD-10-CM | POA: Diagnosis not present

## 2018-10-22 ENCOUNTER — Other Ambulatory Visit: Payer: Self-pay

## 2018-10-22 ENCOUNTER — Encounter: Payer: Self-pay | Admitting: Registered"

## 2018-10-22 ENCOUNTER — Encounter: Payer: Commercial Managed Care - PPO | Attending: Obstetrics and Gynecology | Admitting: Registered"

## 2018-10-22 DIAGNOSIS — R7303 Prediabetes: Secondary | ICD-10-CM | POA: Diagnosis present

## 2018-10-22 NOTE — Progress Notes (Signed)
  Medical Nutrition Therapy:  Appt start time: 8:05 end time:  9:08.   Assessment:  Primary concerns today: Prediabetes. Recent A1c 5.9.   Pt expectations: learning how to eat better, tools to control A1c without medication  Pt arrives stating she is not hungry in the morning. Pt states she likes to pack lunch during the week instead of eating out. Reports she likes yogurt with toppings and tries not to eat anything after dinner.   Pt states she tries to control her weight by making wiser choices. Reports not wanting to have carbohydrates because she thinks they will cause weight gain. Pt states she does not buy sweets because she knows if its in the house, she will eat it. Reports she "will do good and not buy it" when she wants it but sometimes she has "to have it and will buy it". Pt states she is still hungry sometimes at the end of the day.   Pt reports planning to get a treadmill within the next week to increase physical activity. Prefers walking, dancing, and cleaning on the weekends as joyful movement.   Preferred Learning Style:   No preference indicated   Learning Readiness:   Ready  Change in progress   MEDICATIONS: See list   DIETARY INTAKE:  Usual eating pattern includes 2 meals and 1-2 snacks per day.  Everyday foods include milk, granola bar, chocolate, peanut crackers, cheese and crackers, cereal, cottage cheese, fruit, nuts, sandwiches, vegetables, potatoes, rice, steak, chicken, and pork chops.  Avoided foods include brussels sprouts and liver.    24-hr recall:  B ( AM): 1.5 c milk  Snk (10 AM): Kind granola bar  L ( PM): cottage cheese + peaches + carrots + cashews + cheese or sandwich Snk (3 PM): chocolate bar or peanut crackers or cheese and crackers D (6:30-7 PM): cereal (Special K almonds) or steak/chicken/pork chops + green beans/asparagus + potatoes/rice Snk ( PM): none Beverages: 1-2% milk, coffee (creamer), sweet tea (occasionally), water, soda  (2/week)  Usual physical activity: none stated   Estimated energy needs: 1600-1800 calories 180-200 g carbohydrates 120-135 g protein 44-50 g fat  Progress Towards Goal(s):  In progress.   Nutritional Diagnosis:  NB-1.1 Food and nutrition-related knowledge deficit As related to prediabetes.  As evidenced by no previous education provided.    Intervention:  Nutrition education and counseling. Pt was educated and counseled on prediabetes, fiber, My Plate, balanced meals, ways to have balanced meals when eating out, and importance of physical activity. Pt was in agreement with goals listed.  Goals: - Have balanced meals. Include protein + carbohydrates + non-starchy vegetables. Use handout as guide.  - Aim to increase non-starchy vegetable intake to at least 1/2 plate for lunch and dinner.  - Aim to increase fiber content with fruit, vegetables, whole grains, and nuts.  - Snacks combine carbohydrate and protein such as peanut butter crackers, cheese and crackers, cottage cheese and fruit, fruit and nuts, etc.  - Aim to increase physical activity with walking at least 15 min, 2-3 days/week. Goal is to reach at least 30 min, 5 days/week.   Teaching Method Utilized:  Visual Auditory Hands on  Handouts given during visit include:  My Plate for prediabetes/diabetes  Barriers to learning/adherence to lifestyle change: none identified  Demonstrated degree of understanding via:  Teach Back   Monitoring/Evaluation:  Dietary intake, exercise, and body weight prn.

## 2018-10-22 NOTE — Patient Instructions (Addendum)
-   Have balanced meals. Include protein + carbohydrates + non-starchy vegetables. Use handout as guide.   - Aim to increase non-starchy vegetable intake to at least 1/2 plate for lunch and dinner.   - Aim to increase fiber content with fruit, vegetables, whole grains, and nuts.   - Snacks combine carbohydrate and protein such as peanut butter crackers, cheese and crackers, cottage cheese and fruit, fruit and nuts, etc.   - Aim to increase physical activity with walking at least 15 min, 2-3 days/week. Goal is to reach at least 30 min, 5 days/week.

## 2018-10-27 ENCOUNTER — Telehealth: Payer: Self-pay | Admitting: Obstetrics and Gynecology

## 2018-10-27 DIAGNOSIS — Z1211 Encounter for screening for malignant neoplasm of colon: Secondary | ICD-10-CM

## 2018-10-27 NOTE — Telephone Encounter (Signed)
I received a notice that her cologuard script has expired. She really needs to do colon cancer screening, her anemia just makes it more important. A colonoscopy would be better than the cologuard (if her cologuard was +, the colonoscopy is not typically covered).  If she is willing to have a colonoscopy, please place a referral to Dr Carlean Purl.

## 2018-10-28 NOTE — Telephone Encounter (Signed)
Spoke with patient. Advised as seen below per Dr. Talbert Nan.  1. Patient request to proceed with referral to Dr. Carlean Purl for colonoscopy. Order placed. Advised our referral coordinator will f/u with appt details once scheduled.   2. Patient request to change lab appt for f/u CBC and ferritin to earlier time, lab appt rescheduled to 4/17 at 0830.   Routing to provider for final review. Patient is agreeable to disposition. Will close encounter.  Cc: Magdalene Patricia

## 2018-10-28 NOTE — Telephone Encounter (Signed)
Left message to call Arella Blinder, RN at [facility] [phone].   

## 2018-11-28 ENCOUNTER — Other Ambulatory Visit: Payer: Commercial Managed Care - PPO

## 2018-11-28 ENCOUNTER — Other Ambulatory Visit: Payer: Self-pay

## 2018-11-28 ENCOUNTER — Other Ambulatory Visit (INDEPENDENT_AMBULATORY_CARE_PROVIDER_SITE_OTHER): Payer: Commercial Managed Care - PPO

## 2018-11-28 DIAGNOSIS — R899 Unspecified abnormal finding in specimens from other organs, systems and tissues: Secondary | ICD-10-CM

## 2018-11-29 LAB — CBC
Hematocrit: 37.6 % (ref 34.0–46.6)
Hemoglobin: 12.2 g/dL (ref 11.1–15.9)
MCH: 28 pg (ref 26.6–33.0)
MCHC: 32.4 g/dL (ref 31.5–35.7)
MCV: 86 fL (ref 79–97)
Platelets: 288 10*3/uL (ref 150–450)
RBC: 4.36 x10E6/uL (ref 3.77–5.28)
RDW: 15.7 % — ABNORMAL HIGH (ref 11.7–15.4)
WBC: 7 10*3/uL (ref 3.4–10.8)

## 2018-11-29 LAB — FERRITIN: Ferritin: 51 ng/mL (ref 15–150)

## 2018-12-01 ENCOUNTER — Other Ambulatory Visit: Payer: Commercial Managed Care - PPO

## 2019-01-31 ENCOUNTER — Encounter: Payer: Self-pay | Admitting: Physician Assistant

## 2019-02-02 ENCOUNTER — Telehealth: Payer: Self-pay | Admitting: Physician Assistant

## 2019-02-02 NOTE — Telephone Encounter (Signed)
Called and sent my chart message to call in and make appnt

## 2019-02-10 ENCOUNTER — Telehealth (INDEPENDENT_AMBULATORY_CARE_PROVIDER_SITE_OTHER): Payer: Commercial Managed Care - PPO | Admitting: Emergency Medicine

## 2019-02-10 ENCOUNTER — Encounter: Payer: Self-pay | Admitting: Emergency Medicine

## 2019-02-10 VITALS — Ht 58.5 in | Wt 150.0 lb

## 2019-02-10 DIAGNOSIS — R12 Heartburn: Secondary | ICD-10-CM

## 2019-02-10 DIAGNOSIS — F419 Anxiety disorder, unspecified: Secondary | ICD-10-CM | POA: Diagnosis not present

## 2019-02-10 DIAGNOSIS — F32A Depression, unspecified: Secondary | ICD-10-CM

## 2019-02-10 DIAGNOSIS — Z7689 Persons encountering health services in other specified circumstances: Secondary | ICD-10-CM

## 2019-02-10 DIAGNOSIS — F329 Major depressive disorder, single episode, unspecified: Secondary | ICD-10-CM | POA: Diagnosis not present

## 2019-02-10 DIAGNOSIS — J4520 Mild intermittent asthma, uncomplicated: Secondary | ICD-10-CM

## 2019-02-10 DIAGNOSIS — M62838 Other muscle spasm: Secondary | ICD-10-CM

## 2019-02-10 MED ORDER — ESCITALOPRAM OXALATE 20 MG PO TABS: 20.0000 mg | ORAL_TABLET | Freq: Every day | ORAL | 4 refills | Status: AC

## 2019-02-10 MED ORDER — PANTOPRAZOLE SODIUM 40 MG PO TBEC: 40.0000 mg | DELAYED_RELEASE_TABLET | Freq: Every day | ORAL | 4 refills | Status: DC

## 2019-02-10 MED ORDER — FLUTICASONE-SALMETEROL 100-50 MCG/DOSE IN AEPB: 1.0000 | INHALATION_SPRAY | Freq: Two times a day (BID) | RESPIRATORY_TRACT | 4 refills | Status: DC

## 2019-02-10 MED ORDER — METAXALONE 800 MG PO TABS: 400.0000 mg | ORAL_TABLET | Freq: Three times a day (TID) | ORAL | 1 refills | Status: AC

## 2019-02-10 MED ORDER — ALBUTEROL SULFATE HFA 108 (90 BASE) MCG/ACT IN AERS: 2.0000 | INHALATION_SPRAY | Freq: Four times a day (QID) | RESPIRATORY_TRACT | 5 refills | Status: DC | PRN

## 2019-02-10 NOTE — Progress Notes (Signed)
Depression screen [facility] 2/9 02/10/2019 10/22/2018 05/22/2018 01/17/2018 07/06/2017  Decreased Interest 0 0 0 0 0  Down, Depressed, Hopeless 0 0 0 0 0  PHQ - 2 Score 0 0 0 0 0      Telemedicine Encounter- SOAP NOTE Established Patient  This telephone encounter was conducted with the patient's (or proxy's) verbal consent via audio telecommunications: yes/no: Yes Patient was instructed to have this encounter in a suitably private space; and to only have persons present to whom they give permission to participate. In addition, patient identity was confirmed by use of name plus two identifiers (DOB and address).  I discussed the limitations, risks, security and privacy concerns of performing an evaluation and management service by telephone and the availability of in person appointments. I also discussed with the patient that there may be a patient responsible charge related to this service. The patient expressed understanding and agreed to proceed.  I spent a total of TIME; 0 MIN TO 60 MIN: 10 minutes talking with the patient or their proxy.  No chief complaint on file. Follow-up on chronic medical problems and medication refill  Subjective   Kathleen Craig is a 53 y.o. female established patient. Telephone visit today to establish care with me.  Used to see PA Weber.  Has the following medical problems: 1.  Asthma.  No complaints.  Well-controlled. 2.  Depression since she was 53 years old, on Lexapro.  No complaints.  No psychiatric evaluation. 3.  GERD.  Under control.  Takes PPI. 4.  Occasional low back muscle spasms, takes Skelaxin as needed. 5.  History of basal cell skin cancer.  Controlled. No complaints or medical concerns today. HPI   Patient Active Problem List   Diagnosis Date Noted  . Vitamin D deficiency 08/22/2017  . Cervical high risk HPV (human papillomavirus) test positive 10/16/2016  . History of osteopenia 10/16/2016  . H/O basal cell carcinoma excision 05/26/2015  .  Anxiety and depression 03/23/2015  . Asthma, mild intermittent, well-controlled 03/23/2015    Past Medical History:  Diagnosis Date  . Abnormal Pap smear of cervix 2000   Hx of LEEP and paps normal since  . Allergy   . Anxiety   . Asthma   . Cancer ([place]) 2016, 2010   basal cell on head and collarbone  . Depression   . Hx of osteopenia   . STD (sexually transmitted disease)    HSV II    Current Outpatient Medications  Medication Sig Dispense Refill  . albuterol (PROVENTIL HFA;VENTOLIN HFA) 108 (90 Base) MCG/ACT inhaler Inhale 2 puffs into the lungs every 6 (six) hours as needed for wheezing or shortness of breath. 1 Inhaler 0  . betamethasone valerate ointment (VALISONE) 0.1 % Apply 1 application topically 2 (two) times daily. Can use for up to 2 weeks as needed 15 g 0  . escitalopram (LEXAPRO) 20 MG tablet Take 1 tablet (20 mg total) by mouth daily. 90 tablet 4  . Ferrous Sulfate (IRON PO) Take by mouth.    . Fluticasone-Salmeterol (ADVAIR DISKUS) 100-50 MCG/DOSE AEPB Inhale 1 puff into the lungs 2 (two) times daily. 180 each 4  . LORazepam (ATIVAN) 1 MG tablet Take 1 tablet (1 mg total) by mouth daily as needed for anxiety. 20 tablet 0  . medroxyPROGESTERone (PROVERA) 5 MG tablet Take one tablet a day for 5 days every other month if no spontaneous menses. 15 tablet 1  . metaxalone (SKELAXIN) 800 MG tablet Take 0.5-1 tablets (400-800 mg  total) by mouth 3 (three) times daily. 90 tablet 1  . Multiple Vitamin (MULTIVITAMIN) capsule Take 1 capsule by mouth daily.    . Omega-3 Fatty Acids (FISH OIL) 1000 MG CAPS Take by mouth.    . pantoprazole (PROTONIX) 40 MG tablet Take 1 tablet (40 mg total) by mouth daily. 90 tablet 4  . valACYclovir (VALTREX) 500 MG tablet TAKE 1 TABLET BY MOUTH EVERY DAY, INCREASE TO 1 TABLET TWICE DAILY FOR 3 DAYS FOR OUTBREAK 90 tablet 4  . trolamine salicylate (ASPERCREME/ALOE) 10 % cream Apply 1 application topically as needed for muscle pain. 85 g 0   No  current facility-administered medications for this visit.     Allergies  Allergen Reactions  . Sulfur Hives  . Metronidazole Itching    Social History   Socioeconomic History  . Marital status: Soil scientist    Spouse name: Not on file  . Number of children: Not on file  . Years of education: Not on file  . Highest education level: Not on file  Occupational History  . Occupation: accounting    Comment: [employer]  Social Needs  . Financial resource strain: Not on file  . Food insecurity    Worry: Not on file    Inability: Not on file  . Transportation needs    Medical: Not on file    Non-medical: Not on file  Tobacco Use  . Smoking status: Never Smoker  . Smokeless tobacco: Never Used  Substance and Sexual Activity  . Alcohol use: Yes    Comment: rarely  . Drug use: No  . Sexual activity: Yes    Comment: partner w/ vasectomy  Lifestyle  . Physical activity    Days per week: Not on file    Minutes per session: Not on file  . Stress: Not on file  Relationships  . Social Herbalist on phone: Not on file    Gets together: Not on file    Attends religious service: Not on file    Active member of club or organization: Not on file    Attends meetings of clubs or organizations: Not on file    Relationship status: Not on file  . Intimate partner violence    Fear of current or ex partner: Not on file    Emotionally abused: Not on file    Physically abused: Not on file    Forced sexual activity: Not on file  Other Topics Concern  . Not on file  Social History Narrative   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 [employer]   Exercise - no   Diet - try to eat healthy - could make some changes    Review of Systems  Constitutional: Negative.  Negative for chills and fever.  HENT: Negative.  Negative for congestion, nosebleeds and sore throat.   Eyes: Negative.   Respiratory: Negative.  Negative for cough and shortness of breath.   Cardiovascular: Negative.   Negative for chest pain and palpitations.  Gastrointestinal: Negative.  Negative for abdominal pain, diarrhea, nausea and vomiting.  Genitourinary: Negative.  Negative for dysuria.  Musculoskeletal: Negative.  Negative for myalgias.  Skin: Negative.  Negative for rash.  Neurological: Negative.  Negative for dizziness and headaches.  Endo/Heme/Allergies: Negative.   All other systems reviewed and are negative.   Objective   Vitals as reported by the patient: Today's Vitals   02/10/19 1431  Weight: 150 lb (68 kg)  Height: 4' 10.5" (1.486  m)  Alert and oriented x3 in no apparent respiratory distress.  Diagnoses and all orders for this visit:  Anxiety and depression -     escitalopram (LEXAPRO) 20 MG tablet; Take 1 tablet (20 mg total) by mouth daily.  Heartburn -     pantoprazole (PROTONIX) 40 MG tablet; Take 1 tablet (40 mg total) by mouth daily.  Asthma, mild intermittent, well-controlled -     Fluticasone-Salmeterol (ADVAIR DISKUS) 100-50 MCG/DOSE AEPB; Inhale 1 puff into the lungs 2 (two) times daily. -     albuterol (VENTOLIN HFA) 108 (90 Base) MCG/ACT inhaler; Inhale 2 puffs into the lungs every 6 (six) hours as needed for wheezing or shortness of breath.  Muscle spasms of both lower extremities -     metaxalone (SKELAXIN) 800 MG tablet; Take 0.5-1 tablets (400-800 mg total) by mouth 3 (three) times daily. Take only as needed.  Encounter to establish care    Clinically stable.  No medical concerns identified during this visit. Continue present medications.  No changes. Office visit in 3 to 6 months.  I discussed the assessment and treatment plan with the patient. The patient was provided an opportunity to ask questions and all were answered. The patient agreed with the plan and demonstrated an understanding of the instructions.   The patient was advised to call back or seek an in-person evaluation if the symptoms worsen or if the condition fails to improve as  anticipated.  I provided 10 minutes of non-face-to-face time during this encounter.  Horald Pollen, MD  Primary Care at [facility]

## 2019-02-10 NOTE — Progress Notes (Signed)
Called patient to triage for appointment. Patient want to establish care she is a former patient of [facility]. Patient states she needs medication refill on her medications. These medications are pend.

## 2019-02-13 NOTE — Progress Notes (Signed)
Scheduled for 06/11/2019

## 2019-06-11 ENCOUNTER — Encounter: Payer: Self-pay | Admitting: Emergency Medicine

## 2019-06-11 ENCOUNTER — Other Ambulatory Visit: Payer: Self-pay

## 2019-06-11 ENCOUNTER — Ambulatory Visit: Payer: Commercial Managed Care - PPO | Admitting: Emergency Medicine

## 2019-06-11 VITALS — BP 101/68 | HR 75 | Temp 98.5°F | Resp 16 | Ht 58.5 in | Wt 154.4 lb

## 2019-06-11 DIAGNOSIS — F419 Anxiety disorder, unspecified: Secondary | ICD-10-CM | POA: Diagnosis not present

## 2019-06-11 DIAGNOSIS — Z760 Encounter for issue of repeat prescription: Secondary | ICD-10-CM

## 2019-06-11 DIAGNOSIS — Z7689 Persons encountering health services in other specified circumstances: Secondary | ICD-10-CM | POA: Diagnosis not present

## 2019-06-11 DIAGNOSIS — A6000 Herpesviral infection of urogenital system, unspecified: Secondary | ICD-10-CM

## 2019-06-11 DIAGNOSIS — J4520 Mild intermittent asthma, uncomplicated: Secondary | ICD-10-CM | POA: Diagnosis not present

## 2019-06-11 DIAGNOSIS — F32A Depression, unspecified: Secondary | ICD-10-CM

## 2019-06-11 DIAGNOSIS — F329 Major depressive disorder, single episode, unspecified: Secondary | ICD-10-CM

## 2019-06-11 MED ORDER — VALACYCLOVIR HCL 500 MG PO TABS: ORAL_TABLET | ORAL | 4 refills | Status: DC

## 2019-06-11 MED ORDER — LORAZEPAM 1 MG PO TABS: 1.0000 mg | ORAL_TABLET | Freq: Every day | ORAL | 0 refills | Status: DC | PRN

## 2019-06-11 MED ORDER — ALBUTEROL SULFATE HFA 108 (90 BASE) MCG/ACT IN AERS: 2.0000 | INHALATION_SPRAY | Freq: Four times a day (QID) | RESPIRATORY_TRACT | 5 refills | Status: DC | PRN

## 2019-06-11 MED ORDER — FLUTICASONE-SALMETEROL 100-50 MCG/DOSE IN AEPB: 1.0000 | INHALATION_SPRAY | Freq: Two times a day (BID) | RESPIRATORY_TRACT | 4 refills | Status: AC

## 2019-06-11 NOTE — Patient Instructions (Addendum)
   If you have lab work done today you will be contacted with your lab results within the next 2 weeks.  If you have not heard from us then please contact us. The fastest way to get your results is to register for My Chart.   IF you received an x-ray today, you will receive an invoice from [facility]. Please contact [facility] at [phone] with questions or concerns regarding your invoice.   IF you received labwork today, you will receive an invoice from [facility]. Please contact [facility] at [phone] with questions or concerns regarding your invoice.   Our billing staff will not be able to assist you with questions regarding bills from these companies.  You will be contacted with the lab results as soon as they are available. The fastest way to get your results is to activate your My Chart account. Instructions are located on the last page of this paperwork. If you have not heard from us regarding the results in 2 weeks, please contact this office.      Health Maintenance, Female Adopting a healthy lifestyle and getting preventive care are important in promoting health and wellness. Ask your health care provider about:  The right schedule for you to have regular tests and exams.  Things you can do on your own to prevent diseases and keep yourself healthy. What should I know about diet, weight, and exercise? Eat a healthy diet   Eat a diet that includes plenty of vegetables, fruits, low-fat dairy products, and lean protein.  Do not eat a lot of foods that are high in solid fats, added sugars, or sodium. Maintain a healthy weight Body mass index (BMI) is used to identify weight problems. It estimates body fat based on height and weight. Your health care provider can help determine your BMI and help you achieve or maintain a healthy weight. Get regular exercise Get regular exercise. This is one of the most important things you can do for your health. Most  adults should:  Exercise for at least 150 minutes each week. The exercise should increase your heart rate and make you sweat (moderate-intensity exercise).  Do strengthening exercises at least twice a week. This is in addition to the moderate-intensity exercise.  Spend less time sitting. Even light physical activity can be beneficial. Watch cholesterol and blood lipids Have your blood tested for lipids and cholesterol at 51 years of age, then have this test every 5 years. Have your cholesterol levels checked more often if:  Your lipid or cholesterol levels are high.  You are older than 51 years of age.  You are at high risk for heart disease. What should I know about cancer screening? Depending on your health history and family history, you may need to have cancer screening at various ages. This may include screening for:  Breast cancer.  Cervical cancer.  Colorectal cancer.  Skin cancer.  Lung cancer. What should I know about heart disease, diabetes, and high blood pressure? Blood pressure and heart disease  High blood pressure causes heart disease and increases the risk of stroke. This is more likely to develop in people who have high blood pressure readings, are of African descent, or are overweight.  Have your blood pressure checked: ? Every 3-5 years if you are 18-39 years of age. ? Every year if you are 40 years old or older. Diabetes Have regular diabetes screenings. This checks your fasting blood sugar level. Have the screening done:  Once every   three years after age 40 if you are at a normal weight and have a low risk for diabetes.  More often and at a younger age if you are overweight or have a high risk for diabetes. What should I know about preventing infection? Hepatitis B If you have a higher risk for hepatitis B, you should be screened for this virus. Talk with your health care provider to find out if you are at risk for hepatitis B infection. Hepatitis  C Testing is recommended for:  Everyone born from 1945 through 1965.  Anyone with known risk factors for hepatitis C. Sexually transmitted infections (STIs)  Get screened for STIs, including gonorrhea and chlamydia, if: ? You are sexually active and are younger than 51 years of age. ? You are older than 51 years of age and your health care provider tells you that you are at risk for this type of infection. ? Your sexual activity has changed since you were last screened, and you are at increased risk for chlamydia or gonorrhea. Ask your health care provider if you are at risk.  Ask your health care provider about whether you are at high risk for HIV. Your health care provider may recommend a prescription medicine to help prevent HIV infection. If you choose to take medicine to prevent HIV, you should first get tested for HIV. You should then be tested every 3 months for as long as you are taking the medicine. Pregnancy  If you are about to stop having your period (premenopausal) and you may become pregnant, seek counseling before you get pregnant.  Take 400 to 800 micrograms (mcg) of folic acid every day if you become pregnant.  Ask for birth control (contraception) if you want to prevent pregnancy. Osteoporosis and menopause Osteoporosis is a disease in which the bones lose minerals and strength with aging. This can result in bone fractures. If you are 65 years old or older, or if you are at risk for osteoporosis and fractures, ask your health care provider if you should:  Be screened for bone loss.  Take a calcium or vitamin D supplement to lower your risk of fractures.  Be given hormone replacement therapy (HRT) to treat symptoms of menopause. Follow these instructions at home: Lifestyle  Do not use any products that contain nicotine or tobacco, such as cigarettes, e-cigarettes, and chewing tobacco. If you need help quitting, ask your health care provider.  Do not use street  drugs.  Do not share needles.  Ask your health care provider for help if you need support or information about quitting drugs. Alcohol use  Do not drink alcohol if: ? Your health care provider tells you not to drink. ? You are pregnant, may be pregnant, or are planning to become pregnant.  If you drink alcohol: ? Limit how much you use to 0-1 drink a day. ? Limit intake if you are breastfeeding.  Be aware of how much alcohol is in your drink. In the [place] one drink equals one 12 oz bottle of beer (355 mL), one 5 oz glass of wine (148 mL), or one 1 oz glass of hard liquor (44 mL). General instructions  Schedule regular health, dental, and eye exams.  Stay current with your vaccines.  Tell your health care provider if: ? You often feel depressed. ? You have ever been abused or do not feel safe at home. Summary  Adopting a healthy lifestyle and getting preventive care are important in promoting health and wellness.    Follow your health care provider's instructions about healthy diet, exercising, and getting tested or screened for diseases.  Follow your health care provider's instructions on monitoring your cholesterol and blood pressure. This information is not intended to replace advice given to you by your health care provider. Make sure you discuss any questions you have with your health care provider. Document Released: 02/12/2011 Document Revised: 07/23/2018 Document Reviewed: 07/23/2018 Elsevier Patient Education  2020 [employer]  

## 2019-06-11 NOTE — Progress Notes (Signed)
Kathleen Craig 53 y.o.   Chief Complaint  Patient presents with  . Establish Care    Transfer of care  . Medication Refill    HISTORY OF PRESENT ILLNESS: This is a 53 y.o. female here to establish care with me and medication refills.  Has the following chronic medical problems: 1.  Generalized anxiety disorder: On Lexapro and occasional lorazepam.  Doing well and only using lorazepam rarely. 2.  Asthma: Well-controlled on Advair, only using rescue inhaler albuterol every 3 to 4 months if ever.  Well-controlled. 3.  History of GERD: On Protonix daily.  Working well.  Well-controlled. 4.  Intermittent back spasms: Occasionally takes Skelaxin.  Working well.  No complaints. 5.  Recurrent herpes infections, on prophylaxis doses of valacyclovir.  Working well.  No recurrent breakouts with prophylaxis. Has no complaints or medical concerns today.  HPI   Prior to Admission medications   Medication Sig Start Date End Date Taking? Authorizing Provider  albuterol (VENTOLIN HFA) 108 (90 Base) MCG/ACT inhaler Inhale 2 puffs into the lungs every 6 (six) hours as needed for wheezing or shortness of breath. 02/10/19  Yes Piera Downs, Ines Bloomer, MD  betamethasone valerate ointment (VALISONE) 0.1 % Apply 1 application topically 2 (two) times daily. Can use for up to 2 weeks as needed 09/30/18  Yes Salvadore Dom, MD  escitalopram (LEXAPRO) 20 MG tablet Take 1 tablet (20 mg total) by mouth daily. 02/10/19  Yes Lelar Farewell, Ines Bloomer, MD  Ferrous Sulfate (IRON PO) Take by mouth.   Yes [provider]  Fluticasone-Salmeterol (ADVAIR DISKUS) 100-50 MCG/DOSE AEPB Inhale 1 puff into the lungs 2 (two) times daily. 02/10/19  Yes Maryfrances Portugal, Ines Bloomer, MD  LORazepam (ATIVAN) 1 MG tablet Take 1 tablet (1 mg total) by mouth daily as needed for anxiety. 01/17/18  Yes Weber, Damaris Hippo, PA-C  metaxalone (SKELAXIN) 800 MG tablet Take 0.5-1 tablets (400-800 mg total) by mouth 3 (three) times daily. Take only as  needed. 02/10/19  Yes SagardiaInes Bloomer, MD  Multiple Vitamin (MULTIVITAMIN) capsule Take 1 capsule by mouth daily.   Yes [provider]  Omega-3 Fatty Acids (FISH OIL) 1000 MG CAPS Take by mouth.   Yes [provider]  pantoprazole (PROTONIX) 40 MG tablet Take 1 tablet (40 mg total) by mouth daily. 02/10/19  Yes Ogechi Kuehnel, Ines Bloomer, MD  valACYclovir (VALTREX) 500 MG tablet TAKE 1 TABLET BY MOUTH EVERY DAY, INCREASE TO 1 TABLET TWICE DAILY FOR 3 DAYS FOR OUTBREAK 09/30/18  Yes Salvadore Dom, MD  medroxyPROGESTERone (PROVERA) 5 MG tablet Take one tablet a day for 5 days every other month if no spontaneous menses. Patient not taking: Reported on 06/11/2019 09/30/18   Salvadore Dom, MD  trolamine salicylate (ASPERCREME/ALOE) 10 % cream Apply 1 application topically as needed for muscle pain. 05/22/18   McVey, Gelene Mink, PA-C    Allergies  Allergen Reactions  . Sulfur Hives  . Metronidazole Itching    Patient Active Problem List   Diagnosis Date Noted  . Vitamin D deficiency 08/22/2017  . Cervical high risk HPV (human papillomavirus) test positive 10/16/2016  . History of osteopenia 10/16/2016  . H/O basal cell carcinoma excision 05/26/2015  . Anxiety and depression 03/23/2015  . Asthma, mild intermittent, well-controlled 03/23/2015    Past Medical History:  Diagnosis Date  . Abnormal Pap smear of cervix 2000   Hx of LEEP and paps normal since  . Allergy   . Anxiety   . Asthma   .  Cancer ([place]) 2016, 2010   basal cell on head and collarbone  . Depression   . Hx of osteopenia   . STD (sexually transmitted disease)    HSV II    Past Surgical History:  Procedure Laterality Date  . CERVICAL BIOPSY  W/ LOOP ELECTRODE EXCISION  2000   in [place]    Social History   Socioeconomic History  . Marital status: Soil scientist    Spouse name: Not on file  . Number of children: Not on file  . Years of education: Not on file  . Highest  education level: Not on file  Occupational History  . Occupation: accounting    Comment: [employer]  Social Needs  . Financial resource strain: Not on file  . Food insecurity    Worry: Not on file    Inability: Not on file  . Transportation needs    Medical: Not on file    Non-medical: Not on file  Tobacco Use  . Smoking status: Never Smoker  . Smokeless tobacco: Never Used  Substance and Sexual Activity  . Alcohol use: Yes    Comment: rarely  . Drug use: No  . Sexual activity: Yes    Comment: partner w/ vasectomy  Lifestyle  . Physical activity    Days per week: Not on file    Minutes per session: Not on file  . Stress: Not on file  Relationships  . Social Herbalist on phone: Not on file    Gets together: Not on file    Attends religious service: Not on file    Active member of club or organization: Not on file    Attends meetings of clubs or organizations: Not on file    Relationship status: Not on file  . Intimate partner violence    Fear of current or ex partner: Not on file    Emotionally abused: Not on file    Physically abused: Not on file    Forced sexual activity: Not on file  Other Topics Concern  . Not on file  Social History Narrative   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 [employer]   Exercise - no   Diet - try to eat healthy - could make some changes    Family History  Problem Relation Age of Onset  . Heart disease Maternal Grandfather   . Hypertension Maternal Grandfather   . Diabetes Mother        diet controlled  . Hyperlipidemia Mother   . Hypertension Mother   . Hyperlipidemia Father   . Alzheimer's disease Paternal Grandfather   . Cancer Other      Review of Systems  Constitutional: Negative.  Negative for chills and fever.  HENT: Negative.  Negative for congestion and sore throat.   Eyes: Negative.   Respiratory: Negative.  Negative for cough and shortness of breath.   Cardiovascular: Negative.  Negative for chest pain and  palpitations.  Gastrointestinal: Negative for abdominal pain, diarrhea, nausea and vomiting.  Genitourinary: Negative.  Negative for dysuria and hematuria.  Musculoskeletal: Negative.  Negative for back pain, myalgias and neck pain.  Skin: Negative.  Negative for rash.  Neurological: Negative for dizziness and headaches.  All other systems reviewed and are negative.  Vitals:   06/11/19 1558  BP: 101/68  Pulse: 75  Resp: 16  Temp: 98.5 F (36.9 C)  SpO2: 96%     Physical Exam Vitals signs reviewed.  Constitutional:  Appearance: Normal appearance.  HENT:     Head: Normocephalic.  Eyes:     Extraocular Movements: Extraocular movements intact.     Conjunctiva/sclera: Conjunctivae normal.     Pupils: Pupils are equal, round, and reactive to light.  Neck:     Musculoskeletal: Normal range of motion and neck supple.  Cardiovascular:     Rate and Rhythm: Normal rate and regular rhythm.     Pulses: Normal pulses.     Heart sounds: Normal heart sounds.  Pulmonary:     Effort: Pulmonary effort is normal.     Breath sounds: Normal breath sounds.  Musculoskeletal: Normal range of motion.  Skin:    General: Skin is warm and dry.     Capillary Refill: Capillary refill takes less than 2 seconds.  Neurological:     General: No focal deficit present.     Mental Status: She is alert and oriented to person, place, and time.  Psychiatric:        Mood and Affect: Mood normal.        Behavior: Behavior normal.      ASSESSMENT & PLAN: Shelbia was seen today for establish care and medication refill.  Diagnoses and all orders for this visit:  Encounter to establish care  Asthma, mild intermittent, well-controlled -     albuterol (VENTOLIN HFA) 108 (90 Base) MCG/ACT inhaler; Inhale 2 puffs into the lungs every 6 (six) hours as needed for wheezing or shortness of breath. -     Fluticasone-Salmeterol (ADVAIR DISKUS) 100-50 MCG/DOSE AEPB; Inhale 1 puff into the lungs 2 (two) times  daily.  Anxiety and depression -     LORazepam (ATIVAN) 1 MG tablet; Take 1 tablet (1 mg total) by mouth daily as needed for anxiety.  Genital herpes simplex, unspecified site -     valACYclovir (VALTREX) 500 MG tablet; TAKE 1 TABLET BY MOUTH EVERY DAY, INCREASE TO 1 TABLET TWICE DAILY FOR 3 DAYS FOR OUTBREAK  Encounter for medication refill    Patient Instructions       If you have lab work done today you will be contacted with your lab results within the next 2 weeks.  If you have not heard from [place] then please contact us. The fastest way to get your results is to register for My Chart.   IF you received an x-ray today, you will receive an invoice from [facility]. Please contact [facility] at [phone] with questions or concerns regarding your invoice.   IF you received labwork today, you will receive an invoice from [facility]. Please contact [facility] at [phone] with questions or concerns regarding your invoice.   Our billing staff will not be able to assist you with questions regarding bills from these companies.  You will be contacted with the lab results as soon as they are available. The fastest way to get your results is to activate your My Chart account. Instructions are located on the last page of this paperwork. If you have not heard from [place] regarding the results in 2 weeks, please contact this office.     Health Maintenance, Female Adopting a healthy lifestyle and getting preventive care are important in promoting health and wellness. Ask your health care provider about:  The right schedule for you to have regular tests and exams.  Things you can do on your own to prevent diseases and keep yourself healthy. What should I know about diet, weight, and exercise? Eat a healthy diet   Eat a  diet that includes plenty of vegetables, fruits, low-fat dairy products, and lean protein.  Do not eat a lot of foods that are high in solid fats, added  sugars, or sodium. Maintain a healthy weight Body mass index (BMI) is used to identify weight problems. It estimates body fat based on height and weight. Your health care provider can help determine your BMI and help you achieve or maintain a healthy weight. Get regular exercise Get regular exercise. This is one of the most important things you can do for your health. Most adults should:  Exercise for at least 150 minutes each week. The exercise should increase your heart rate and make you sweat (moderate-intensity exercise).  Do strengthening exercises at least twice a week. This is in addition to the moderate-intensity exercise.  Spend less time sitting. Even light physical activity can be beneficial. Watch cholesterol and blood lipids Have your blood tested for lipids and cholesterol at 51 years of age, then have this test every 5 years. Have your cholesterol levels checked more often if:  Your lipid or cholesterol levels are high.  You are older than 51 years of age.  You are at high risk for heart disease. What should I know about cancer screening? Depending on your health history and family history, you may need to have cancer screening at various ages. This may include screening for:  Breast cancer.  Cervical cancer.  Colorectal cancer.  Skin cancer.  Lung cancer. What should I know about heart disease, diabetes, and high blood pressure? Blood pressure and heart disease  High blood pressure causes heart disease and increases the risk of stroke. This is more likely to develop in people who have high blood pressure readings, are of African descent, or are overweight.  Have your blood pressure checked: ? Every 3-5 years if you are 40-35 years of age. ? Every year if you are 14 years old or older. Diabetes Have regular diabetes screenings. This checks your fasting blood sugar level. Have the screening done:  Once every three years after age 75 if you are at a normal  weight and have a low risk for diabetes.  More often and at a younger age if you are overweight or have a high risk for diabetes. What should I know about preventing infection? Hepatitis B If you have a higher risk for hepatitis B, you should be screened for this virus. Talk with your health care provider to find out if you are at risk for hepatitis B infection. Hepatitis C Testing is recommended for:  Everyone born from [birth year] through 1963.  Anyone with known risk factors for hepatitis C. Sexually transmitted infections (STIs)  Get screened for STIs, including gonorrhea and chlamydia, if: ? You are sexually active and are younger than 51 years of age. ? You are older than 51 years of age and your health care provider tells you that you are at risk for this type of infection. ? Your sexual activity has changed since you were last screened, and you are at increased risk for chlamydia or gonorrhea. Ask your health care provider if you are at risk.  Ask your health care provider about whether you are at high risk for HIV. Your health care provider may recommend a prescription medicine to help prevent HIV infection. If you choose to take medicine to prevent HIV, you should first get tested for HIV. You should then be tested every 3 months for as long as you are taking the medicine. Pregnancy  If you are about to stop having your period (premenopausal) and you may become pregnant, seek counseling before you get pregnant.  Take 400 to 800 micrograms (mcg) of folic acid every day if you become pregnant.  Ask for birth control (contraception) if you want to prevent pregnancy. Osteoporosis and menopause Osteoporosis is a disease in which the bones lose minerals and strength with aging. This can result in bone fractures. If you are 74 years old or older, or if you are at risk for osteoporosis and fractures, ask your health care provider if you should:  Be screened for bone loss.  Take a calcium  or vitamin D supplement to lower your risk of fractures.  Be given hormone replacement therapy (HRT) to treat symptoms of menopause. Follow these instructions at home: Lifestyle  Do not use any products that contain nicotine or tobacco, such as cigarettes, e-cigarettes, and chewing tobacco. If you need help quitting, ask your health care provider.  Do not use street drugs.  Do not share needles.  Ask your health care provider for help if you need support or information about quitting drugs. Alcohol use  Do not drink alcohol if: ? Your health care provider tells you not to drink. ? You are pregnant, may be pregnant, or are planning to become pregnant.  If you drink alcohol: ? Limit how much you use to 0-1 drink a day. ? Limit intake if you are breastfeeding.  Be aware of how much alcohol is in your drink. In the [place] one drink equals one 12 oz bottle of beer (355 mL), one 5 oz glass of wine (148 mL), or one 1 oz glass of hard liquor (44 mL). General instructions  Schedule regular health, dental, and eye exams.  Stay current with your vaccines.  Tell your health care provider if: ? You often feel depressed. ? You have ever been abused or do not feel safe at home. Summary  Adopting a healthy lifestyle and getting preventive care are important in promoting health and wellness.  Follow your health care provider's instructions about healthy diet, exercising, and getting tested or screened for diseases.  Follow your health care provider's instructions on monitoring your cholesterol and blood pressure. This information is not intended to replace advice given to you by your health care provider. Make sure you discuss any questions you have with your health care provider. Document Released: 02/12/2011 Document Revised: 07/23/2018 Document Reviewed: 07/23/2018 Elsevier Patient Education  2020 Elsevier Inc.      Agustina Caroli, MD [facility]

## 2019-10-05 NOTE — Progress Notes (Signed)
54 y.o. G1P1001 Domestic Partner White or Caucasian Not Hispanic or Latino female here for annual exam.  Patient states that she has no concerns. She has been getting a cycle every couple of months. She did go 4 months without a cycle in the last year. LMP 08/13/19. Typically bleeds for 4-5 days, last cycle she bleed for 1.5 weeks. Not heavy, no cramps.  Some mild hot flashes, no sweats. Occasional vaginal dryness, uses a lubricant if needed. No dyspareunia.      H/O genital hsv, takes valtrex every other day which helps.   Patient's LMP was 08/13/19.       Sexually active: Yes.    The current method of family planning is post menopausal status.    Exercising: Yes.    walking  Smoker:  no  Health Maintenance: Pap:  08/22/2017 WNL NEG HPV, 10/2016 Neg History of abnormal Pap:  Yes LEEP in 2000 MMG:  10/07/18 Density C Bi-rads  BMD:   11-13-16 normal:TBC--Hx Osteopenia and hx of Boniva. She was on prednisone secondary to asthma. Last DEXA was normal. Colonoscopy: Never  TDaP:  10.13.16  Gardasil: NA   reports that she has never smoked. She has never used smokeless tobacco. She reports current alcohol use. She reports that she does not use drugs. 1-2 drinks a week. She is an Web designer. Lives with her partner. Daughter is 74  Past Medical History:  Diagnosis Date  . Abnormal Pap smear of cervix 2000   Hx of LEEP and paps normal since  . Allergy   . Anxiety   . Asthma   . Cancer ([place]) 2016, 2010   basal cell on head and collarbone  . Depression   . Hx of osteopenia   . STD (sexually transmitted disease)    HSV II    Past Surgical History:  Procedure Laterality Date  . CERVICAL BIOPSY  W/ LOOP ELECTRODE EXCISION  2000   in [place]    Current Outpatient Medications  Medication Sig Dispense Refill  . betamethasone valerate ointment (VALISONE) 0.1 % Apply 1 application topically 2 (two) times daily. Can use for up to 2 weeks as needed 15 g 0  . escitalopram (LEXAPRO) 20  MG tablet Take 1 tablet (20 mg total) by mouth daily. 90 tablet 4  . Ferrous Sulfate (IRON PO) Take by mouth.    . Fluticasone-Salmeterol (ADVAIR DISKUS) 100-50 MCG/DOSE AEPB Inhale 1 puff into the lungs 2 (two) times daily. 180 each 4  . LORazepam (ATIVAN) 1 MG tablet Take 1 tablet (1 mg total) by mouth daily as needed for anxiety. 20 tablet 0  . medroxyPROGESTERone (PROVERA) 5 MG tablet Take one tablet a day for 5 days every other month if no spontaneous menses. 15 tablet 1  . metaxalone (SKELAXIN) 800 MG tablet Take 0.5-1 tablets (400-800 mg total) by mouth 3 (three) times daily. Take only as needed. 90 tablet 1  . Multiple Vitamin (MULTIVITAMIN) capsule Take 1 capsule by mouth daily.    . Omega-3 Fatty Acids (FISH OIL) 1000 MG CAPS Take by mouth.    . pantoprazole (PROTONIX) 40 MG tablet Take 1 tablet (40 mg total) by mouth daily. 90 tablet 4  . valACYclovir (VALTREX) 500 MG tablet TAKE 1 TABLET BY MOUTH EVERY DAY, INCREASE TO 1 TABLET TWICE DAILY FOR 3 DAYS FOR OUTBREAK 90 tablet 4   No current facility-administered medications for this visit.    Family History  Problem Relation Age of Onset  . Heart disease Maternal  Grandfather   . Hypertension Maternal Grandfather   . Diabetes Mother        diet controlled  . Hyperlipidemia Mother   . Hypertension Mother   . Hyperlipidemia Father   . Alzheimer's disease Paternal Grandfather   . Cancer Other     Review of Systems  All other systems reviewed and are negative.   Exam:   BP 104/68   Pulse 75   Temp 97.9 F (36.6 C)   Ht 4' 9.25" (1.454 m)   Wt 142 lb (64.4 kg)   LMP 05/12/2018 (Exact Date)   SpO2 97%   BMI 30.46 kg/m   Weight change: @WEIGHTCHANGE @ Height:   Height: 4' 9.25" (145.4 cm)  Ht Readings from Last 3 Encounters:  10/07/19 4' 9.25" (1.454 m)  06/11/19 4' 10.5" (1.486 m)  02/10/19 4' 10.5" (1.486 m)    General appearance: alert, cooperative and appears stated age Head: Normocephalic, without obvious  abnormality, atraumatic Neck: no adenopathy, supple, symmetrical, trachea midline and thyroid normal to inspection and palpation Lungs: clear to auscultation bilaterally Cardiovascular: regular rate and rhythm Breasts: normal appearance, no masses or tenderness Abdomen: soft, non-tender; non distended,  no masses,  no organomegaly Extremities: extremities normal, atraumatic, no cyanosis or edema Skin: Skin color, texture, turgor normal. No rashes or lesions Lymph nodes: Cervical, supraclavicular, and axillary nodes normal. No abnormal inguinal nodes palpated Neurologic: Grossly normal   Pelvic: External genitalia: Whitening on the inner labia majora above the clitoris with mild agglutination.               Urethra:  normal appearing urethra with no masses, tenderness or lesions              Bartholins and Skenes: normal                 Vagina: mildly atrophic appearing vagina with a marked increase in watery, yellow, frothy vaginal d/c (patient states long term, unchanged, no other symptoms)              Cervix: no lesions               Bimanual Exam:  Uterus:  normal size, contour, position, consistency, mobility, non-tender              Adnexa: no mass, fullness, tenderness               Rectovaginal: Confirms               Anus:  normal sphincter tone, no lesions  Gae Dry chaperoned for the exam.  A:  Well Woman with normal exam  H/O prediabetes  BMI 30, she has lost 7 lbs in 4 weeks.  Vulvar skin changes, whitening, mild agglutination  P:   Pap next year  Mammogram scheduled  Referral for colonoscopy placed  Discussed breast self exam  Discussed calcium and vit D intake  Cyclic provera  Screening labs, TSH, HgbA1C, FSH  Nuswab vaginitis panel  Vulvar skin care sheet given.  Patient has valisone ointment at home. Will use it on the vulva BID for 2 weeks, then return for vulvar skin check.

## 2019-10-07 ENCOUNTER — Ambulatory Visit: Payer: Commercial Managed Care - PPO | Admitting: Obstetrics and Gynecology

## 2019-10-07 ENCOUNTER — Other Ambulatory Visit: Payer: Self-pay

## 2019-10-07 ENCOUNTER — Encounter: Payer: Self-pay | Admitting: Obstetrics and Gynecology

## 2019-10-07 VITALS — BP 104/68 | HR 75 | Temp 97.9°F | Ht <= 58 in | Wt 142.0 lb

## 2019-10-07 DIAGNOSIS — Z0000 Encounter for general adult medical examination without abnormal findings: Secondary | ICD-10-CM | POA: Diagnosis not present

## 2019-10-07 DIAGNOSIS — Z01419 Encounter for gynecological examination (general) (routine) without abnormal findings: Secondary | ICD-10-CM

## 2019-10-07 DIAGNOSIS — R7303 Prediabetes: Secondary | ICD-10-CM

## 2019-10-07 DIAGNOSIS — N898 Other specified noninflammatory disorders of vagina: Secondary | ICD-10-CM

## 2019-10-07 DIAGNOSIS — N763 Subacute and chronic vulvitis: Secondary | ICD-10-CM

## 2019-10-07 DIAGNOSIS — Z6830 Body mass index (BMI) 30.0-30.9, adult: Secondary | ICD-10-CM

## 2019-10-07 DIAGNOSIS — N951 Menopausal and female climacteric states: Secondary | ICD-10-CM

## 2019-10-07 DIAGNOSIS — Z1211 Encounter for screening for malignant neoplasm of colon: Secondary | ICD-10-CM

## 2019-10-07 DIAGNOSIS — A6000 Herpesviral infection of urogenital system, unspecified: Secondary | ICD-10-CM

## 2019-10-07 MED ORDER — MEDROXYPROGESTERONE ACETATE 5 MG PO TABS: ORAL_TABLET | ORAL | 1 refills | Status: DC

## 2019-10-07 MED ORDER — VALACYCLOVIR HCL 500 MG PO TABS: ORAL_TABLET | ORAL | 4 refills | Status: DC

## 2019-10-07 NOTE — Patient Instructions (Signed)
EXERCISE AND DIET:  We recommended that you start or continue a regular exercise program for good health. Regular exercise means any activity that makes your heart beat faster and makes you sweat.  We recommend exercising at least 30 minutes per day at least 3 days a week, preferably 4 or 5.  We also recommend a diet low in fat and sugar.  Inactivity, poor dietary choices and obesity can cause diabetes, heart attack, stroke, and kidney damage, among others.    ALCOHOL AND SMOKING:  Women should limit their alcohol intake to no more than 7 drinks/beers/glasses of wine (combined, not each!) per week. Moderation of alcohol intake to this level decreases your risk of breast cancer and liver damage. And of course, no recreational drugs are part of a healthy lifestyle.  And absolutely no smoking or even second hand smoke. Most people know smoking can cause heart and lung diseases, but did you know it also contributes to weakening of your bones? Aging of your skin?  Yellowing of your teeth and nails?  CALCIUM AND VITAMIN D:  Adequate intake of calcium and Vitamin D are recommended.  The recommendations for exact amounts of these supplements seem to change often, but generally speaking 1,200 mg of calcium (between diet and supplement) and 800 units of Vitamin D per day seems prudent. Certain women may benefit from higher intake of Vitamin D.  If you are among these women, your doctor will have told you during your visit.    PAP SMEARS:  Pap smears, to check for cervical cancer or precancers,  have traditionally been done yearly, although recent scientific advances have shown that most women can have pap smears less often.  However, every woman still should have a physical exam from her gynecologist every year. It will include a breast check, inspection of the vulva and vagina to check for abnormal growths or skin changes, a visual exam of the cervix, and then an exam to evaluate the size and shape of the uterus and  ovaries.  And after 52 years of age, a rectal exam is indicated to check for rectal cancers. We will also provide age appropriate advice regarding health maintenance, like when you should have certain vaccines, screening for sexually transmitted diseases, bone density testing, colonoscopy, mammograms, etc.   MAMMOGRAMS:  All women over 40 years old should have a yearly mammogram. Many facilities now offer a "3D" mammogram, which may cost around $50 extra out of pocket. If possible,  we recommend you accept the option to have the 3D mammogram performed.  It both reduces the number of women who will be called back for extra views which then turn out to be normal, and it is better than the routine mammogram at detecting truly abnormal areas.    COLON CANCER SCREENING: Now recommend starting at age 45. At this time colonoscopy is not covered for routine screening until 50. There are take home tests that can be done between 45-49.   COLONOSCOPY:  Colonoscopy to screen for colon cancer is recommended for all women at age 50.  We know, you hate the idea of the prep.  We agree, BUT, having colon cancer and not knowing it is worse!!  Colon cancer so often starts as a polyp that can be seen and removed at colonscopy, which can quite literally save your life!  And if your first colonoscopy is normal and you have no family history of colon cancer, most women don't have to have it again for   10 years.  Once every ten years, you can do something that may end up saving your life, right?  We will be happy to help you get it scheduled when you are ready.  Be sure to check your insurance coverage so you understand how much it will cost.  It may be covered as a preventative service at no cost, but you should check your particular policy.      Breast Self-Awareness Breast self-awareness means being familiar with how your breasts look and feel. It involves checking your breasts regularly and reporting any changes to your  health care provider. Practicing breast self-awareness is important. A change in your breasts can be a sign of a serious medical problem. Being familiar with how your breasts look and feel allows you to find any problems early, when treatment is more likely to be successful. All women should practice breast self-awareness, including women who have had breast implants. How to do a breast self-exam One way to learn what is normal for your breasts and whether your breasts are changing is to do a breast self-exam. To do a breast self-exam: Look for Changes  1. Remove all the clothing above your waist. 2. Stand in front of a mirror in a room with good lighting. 3. Put your hands on your hips. 4. Push your hands firmly downward. 5. Compare your breasts in the mirror. Look for differences between them (asymmetry), such as: ? Differences in shape. ? Differences in size. ? Puckers, dips, and bumps in one breast and not the other. 6. Look at each breast for changes in your skin, such as: ? Redness. ? Scaly areas. 7. Look for changes in your nipples, such as: ? Discharge. ? Bleeding. ? Dimpling. ? Redness. ? A change in position. Feel for Changes Carefully feel your breasts for lumps and changes. It is best to do this while lying on your back on the floor and again while sitting or standing in the shower or tub with soapy water on your skin. Feel each breast in the following way:  Place the arm on the side of the breast you are examining above your head.  Feel your breast with the other hand.  Start in the nipple area and make  inch (2 cm) overlapping circles to feel your breast. Use the pads of your three middle fingers to do this. Apply light pressure, then medium pressure, then firm pressure. The light pressure will allow you to feel the tissue closest to the skin. The medium pressure will allow you to feel the tissue that is a little deeper. The firm pressure will allow you to feel the tissue  close to the ribs.  Continue the overlapping circles, moving downward over the breast until you feel your ribs below your breast.  Move one finger-width toward the center of the body. Continue to use the  inch (2 cm) overlapping circles to feel your breast as you move slowly up toward your collarbone.  Continue the up and down exam using all three pressures until you reach your armpit.  Write Down What You Find  Write down what is normal for each breast and any changes that you find. Keep a written record with breast changes or normal findings for each breast. By writing this information down, you do not need to depend only on memory for size, tenderness, or location. Write down where you are in your menstrual cycle, if you are still menstruating. If you are having trouble noticing differences   in your breasts, do not get discouraged. With time you will become more familiar with the variations in your breasts and more comfortable with the exam. How often should I examine my breasts? Examine your breasts every month. If you are breastfeeding, the best time to examine your breasts is after a feeding or after using a breast pump. If you menstruate, the best time to examine your breasts is 5-7 days after your period is over. During your period, your breasts are lumpier, and it may be more difficult to notice changes. When should I see my health care provider? See your health care provider if you notice:  A change in shape or size of your breasts or nipples.  A change in the skin of your breast or nipples, such as a reddened or scaly area.  Unusual discharge from your nipples.  A lump or thick area that was not there before.  Pain in your breasts.  Anything that concerns you.  Perimenopause  Perimenopause is the normal time of life before and after menstrual periods stop completely (menopause). Perimenopause can begin 2-8 years before menopause, and it usually lasts for 1 year after  menopause. During perimenopause, the ovaries may or may not produce an egg. What are the causes? This condition is caused by a natural change in hormone levels that happens as you get older. What increases the risk? This condition is more likely to start at an earlier age if you have certain medical conditions or treatments, including:  A tumor of the pituitary gland in the brain.  A disease that affects the ovaries and hormone production.  Radiation treatment for cancer.  Certain cancer treatments, such as chemotherapy or hormone (anti-estrogen) therapy.  Heavy smoking and excessive alcohol use.  Family history of early menopause. What are the signs or symptoms? Perimenopausal changes affect each woman differently. Symptoms of this condition may include:  Hot flashes.  Night sweats.  Irregular menstrual periods.  Decreased sex drive.  Vaginal dryness.  Headaches.  Mood swings.  Depression.  Memory problems or trouble concentrating.  Irritability.  Tiredness.  Weight gain.  Anxiety.  Trouble getting pregnant. How is this diagnosed? This condition is diagnosed based on your medical history, a physical exam, your age, your menstrual history, and your symptoms. Hormone tests may also be done. How is this treated? In some cases, no treatment is needed. You and your health care provider should make a decision together about whether treatment is necessary. Treatment will be based on your individual condition and preferences. Various treatments are available, such as:  Menopausal hormone therapy (MHT).  Medicines to treat specific symptoms.  Acupuncture.  Vitamin or herbal supplements. Before starting treatment, make sure to let your health care provider know if you have a personal or family history of:  Heart disease.  Breast cancer.  Blood clots.  Diabetes.  Osteoporosis. Follow these instructions at home: Lifestyle  Do not use any products that  contain nicotine or tobacco, such as cigarettes and e-cigarettes. If you need help quitting, ask your health care provider.  Eat a balanced diet that includes fresh fruits and vegetables, whole grains, soybeans, eggs, lean meat, and low-fat dairy.  Get at least 30 minutes of physical activity on 5 or more days each week.  Avoid alcoholic and caffeinated beverages, as well as spicy foods. This may help prevent hot flashes.  Get 7-8 hours of sleep each night.  Dress in layers that can be removed to help you manage hot flashes.  Find ways to manage stress, such as deep breathing, meditation, or journaling. General instructions  Keep track of your menstrual periods, including: ? When they occur. ? How heavy they are and how long they last. ? How much time passes between periods.  Keep track of your symptoms, noting when they start, how often you have them, and how long they last.  Take over-the-counter and prescription medicines only as told by your health care provider.  Take vitamin supplements only as told by your health care provider. These may include calcium, vitamin E, and vitamin D.  Use vaginal lubricants or moisturizers to help with vaginal dryness and improve comfort during sex.  Talk with your health care provider before starting any herbal supplements.  Keep all follow-up visits as told by your health care provider. This is important. This includes any group therapy or counseling. Contact a health care provider if:  You have heavy vaginal bleeding or pass blood clots.  Your period lasts more than 2 days longer than normal.  Your periods are recurring sooner than 21 days.  You bleed after having sex. Get help right away if:  You have chest pain, trouble breathing, or trouble talking.  You have severe depression.  You have pain when you urinate.  You have severe headaches.  You have vision problems. Summary  Perimenopause is the time when a woman's body  begins to move into menopause. This may happen naturally or as a result of other health problems or medical treatments.  Perimenopause can begin 2-8 years before menopause, and it usually lasts for 1 year after menopause.  Perimenopausal symptoms can be managed through medicines, lifestyle changes, and complementary therapies such as acupuncture. This information is not intended to replace advice given to you by your health care provider. Make sure you discuss any questions you have with your health care provider. Document Revised: 07/12/2017 Document Reviewed: 09/04/2016 Elsevier Patient Education  2020 [employer].

## 2019-10-08 LAB — COMPREHENSIVE METABOLIC PANEL
ALT: 28 IU/L (ref 0–32)
AST: 24 IU/L (ref 0–40)
Albumin/Globulin Ratio: 1.6 (ref 1.2–2.2)
Albumin: 4.5 g/dL (ref 3.8–4.9)
Alkaline Phosphatase: 80 IU/L (ref 39–117)
BUN/Creatinine Ratio: 18 (ref 9–23)
BUN: 18 mg/dL (ref 6–24)
Bilirubin Total: 0.4 mg/dL (ref 0.0–1.2)
CO2: 25 mmol/L (ref 20–29)
Calcium: 9.7 mg/dL (ref 8.7–10.2)
Chloride: 102 mmol/L (ref 96–106)
Creatinine, Ser: 0.99 mg/dL (ref 0.57–1.00)
GFR calc Af Amer: 76 mL/min/{1.73_m2} (ref >59)
GFR calc non Af Amer: 66 mL/min/{1.73_m2} (ref >59)
Globulin, Total: 2.9 g/dL (ref 1.5–4.5)
Glucose: 85 mg/dL (ref 65–99)
Potassium: 4.3 mmol/L (ref 3.5–5.2)
Sodium: 142 mmol/L (ref 134–144)
Total Protein: 7.4 g/dL (ref 6.0–8.5)

## 2019-10-08 LAB — LIPID PANEL
Chol/HDL Ratio: 3.9 ratio (ref 0.0–4.4)
Cholesterol, Total: 178 mg/dL (ref 100–199)
HDL: 46 mg/dL (ref >39)
LDL Chol Calc (NIH): 118 mg/dL — ABNORMAL HIGH (ref 0–99)
Triglycerides: 75 mg/dL (ref 0–149)
VLDL Cholesterol Cal: 14 mg/dL (ref 5–40)

## 2019-10-08 LAB — CBC
Hematocrit: 39.3 % (ref 34.0–46.6)
Hemoglobin: 13.6 g/dL (ref 11.1–15.9)
MCH: 30.5 pg (ref 26.6–33.0)
MCHC: 34.6 g/dL (ref 31.5–35.7)
MCV: 88 fL (ref 79–97)
Platelets: 220 10*3/uL (ref 150–450)
RBC: 4.46 x10E6/uL (ref 3.77–5.28)
RDW: 12.7 % (ref 11.7–15.4)
WBC: 7.2 10*3/uL (ref 3.4–10.8)

## 2019-10-08 LAB — TSH: TSH: 2.54 u[IU]/mL (ref 0.450–4.500)

## 2019-10-08 LAB — FOLLICLE STIMULATING HORMONE: FSH: 40.7 m[IU]/mL

## 2019-10-08 LAB — HEMOGLOBIN A1C
Est. average glucose Bld gHb Est-mCnc: 117 mg/dL
Hgb A1c MFr Bld: 5.7 % — ABNORMAL HIGH (ref 4.8–5.6)

## 2019-10-09 LAB — NUSWAB VAGINITIS (VG)
Candida albicans, NAA: NEGATIVE
Candida glabrata, NAA: NEGATIVE
Trich vag by NAA: NEGATIVE

## 2019-10-14 ENCOUNTER — Other Ambulatory Visit: Payer: Self-pay | Admitting: Emergency Medicine

## 2019-10-14 DIAGNOSIS — Z1231 Encounter for screening mammogram for malignant neoplasm of breast: Secondary | ICD-10-CM

## 2019-10-15 ENCOUNTER — Other Ambulatory Visit: Payer: Self-pay

## 2019-10-15 ENCOUNTER — Ambulatory Visit
Admission: RE | Admit: 2019-10-15 | Discharge: 2019-10-15 | Disposition: A | Discharge status: Home / Self Care | Payer: Commercial Managed Care - PPO | Source: Ambulatory Visit

## 2019-10-15 DIAGNOSIS — Z1231 Encounter for screening mammogram for malignant neoplasm of breast: Secondary | ICD-10-CM

## 2019-10-16 ENCOUNTER — Telehealth: Payer: Self-pay | Admitting: Obstetrics and Gynecology

## 2019-10-16 NOTE — Telephone Encounter (Signed)
Patient canceled her 2 week vulvar skin check and will call later to reschedule.

## 2019-10-19 ENCOUNTER — Encounter: Payer: Self-pay | Admitting: Emergency Medicine

## 2019-10-19 ENCOUNTER — Other Ambulatory Visit: Payer: Self-pay

## 2019-10-19 ENCOUNTER — Telehealth (INDEPENDENT_AMBULATORY_CARE_PROVIDER_SITE_OTHER): Payer: Commercial Managed Care - PPO | Admitting: Emergency Medicine

## 2019-10-19 VITALS — Ht 58.5 in | Wt 147.0 lb

## 2019-10-19 DIAGNOSIS — F329 Major depressive disorder, single episode, unspecified: Secondary | ICD-10-CM

## 2019-10-19 DIAGNOSIS — F419 Anxiety disorder, unspecified: Secondary | ICD-10-CM | POA: Diagnosis not present

## 2019-10-19 DIAGNOSIS — F32A Depression, unspecified: Secondary | ICD-10-CM

## 2019-10-19 MED ORDER — BUPROPION HCL ER (XL) 150 MG PO TB24: 150.0000 mg | ORAL_TABLET | Freq: Every day | ORAL | 2 refills | Status: DC

## 2019-10-19 NOTE — Progress Notes (Signed)
Telemedicine Encounter- SOAP NOTE Established Patient MyChart video conference attempted without success. This telephone encounter was conducted with the patient's (or proxy's) verbal consent via audio telecommunications: yes/no: Yes Patient was instructed to have this encounter in a suitably private space; and to only have persons present to whom they give permission to participate. In addition, patient identity was confirmed by use of name plus two identifiers (DOB and address).  I discussed the limitations, risks, security and privacy concerns of performing an evaluation and management service by telephone and the availability of in person appointments. I also discussed with the patient that there may be a patient responsible charge related to this service. The patient expressed understanding and agreed to proceed.  I spent a total of TIME; 0 MIN TO 60 MIN: 15 minutes talking with the patient or their proxy.  Chief Complaint  Patient presents with  . medication review    wellbutrin - per pt wants to know if can restart medication to loose weight, taken before while going thru divorce and it helped    Subjective   Kathleen Craig is a 54 y.o. female established patient. Telephone visit today inquiring about starting Wellbutrin which has helped in the past dealing with anxiety and agitation as well as weight loss. No other complaints or medical concerns today.  HPI   Patient Active Problem List   Diagnosis Date Noted  . Conductive hearing loss of right ear with unrestricted hearing of left ear 10/16/2018  . Pulsatile tinnitus of right ear 10/16/2018  . Vitamin D deficiency 08/22/2017  . Cervical high risk HPV (human papillomavirus) test positive 10/16/2016  . History of osteopenia 10/16/2016  . H/O basal cell carcinoma excision 05/26/2015  . Anxiety and depression 03/23/2015  . Asthma, mild intermittent, well-controlled 03/23/2015    Past Medical History:  Diagnosis Date  .  Abnormal Pap smear of cervix 2000   Hx of LEEP and paps normal since  . Allergy   . Anxiety   . Asthma   . Cancer ([place]) 2016, 2010   basal cell on head and collarbone  . Depression   . Hx of osteopenia   . STD (sexually transmitted disease)    HSV II    Current Outpatient Medications  Medication Sig Dispense Refill  . betamethasone valerate ointment (VALISONE) 0.1 % Apply 1 application topically 2 (two) times daily. Can use for up to 2 weeks as needed 15 g 0  . escitalopram (LEXAPRO) 20 MG tablet Take 1 tablet (20 mg total) by mouth daily. 90 tablet 4  . Ferrous Sulfate (IRON PO) Take by mouth.    . Fluticasone-Salmeterol (ADVAIR DISKUS) 100-50 MCG/DOSE AEPB Inhale 1 puff into the lungs 2 (two) times daily. 180 each 4  . LORazepam (ATIVAN) 1 MG tablet Take 1 tablet (1 mg total) by mouth daily as needed for anxiety. 20 tablet 0  . medroxyPROGESTERone (PROVERA) 5 MG tablet Take one tablet a day for 5 days every other month if no spontaneous menses. 15 tablet 1  . metaxalone (SKELAXIN) 800 MG tablet Take 0.5-1 tablets (400-800 mg total) by mouth 3 (three) times daily. Take only as needed. 90 tablet 1  . Multiple Vitamin (MULTIVITAMIN) capsule Take 1 capsule by mouth daily.    . Omega-3 Fatty Acids (FISH OIL) 1000 MG CAPS Take by mouth.    . pantoprazole (PROTONIX) 40 MG tablet Take 1 tablet (40 mg total) by mouth daily. 90 tablet 4  . valACYclovir (VALTREX) 500 MG tablet  TAKE 1 TABLET BY MOUTH EVERY DAY, INCREASE TO 1 TABLET TWICE DAILY FOR 3 DAYS FOR OUTBREAK 90 tablet 4  . buPROPion (WELLBUTRIN XL) 150 MG 24 hr tablet Take 1 tablet (150 mg total) by mouth daily. 30 tablet 2   No current facility-administered medications for this visit.    Allergies  Allergen Reactions  . Sulfur Hives  . Metronidazole Itching    Social History   Socioeconomic History  . Marital status: Soil scientist    Spouse name: Not on file  . Number of children: Not on file  . Years of education: Not  on file  . Highest education level: Not on file  Occupational History  . Occupation: accounting    Comment: [employer]  Tobacco Use  . Smoking status: Never Smoker  . Smokeless tobacco: Never Used  Substance and Sexual Activity  . Alcohol use: Yes    Comment: rarely  . Drug use: No  . Sexual activity: Yes    Comment: partner w/ vasectomy  Other Topics Concern  . Not on file  Social History Narrative   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 [employer]   Exercise - no   Diet - try to eat healthy - could make some changes   Social Determinants of Health   Financial Resource Strain:   . Difficulty of Paying Living Expenses: Not on file  Food Insecurity:   . Worried About Charity fundraiser in the Last Year: Not on file  . Ran Out of Food in the Last Year: Not on file  Transportation Needs:   . Lack of Transportation (Medical): Not on file  . Lack of Transportation (Non-Medical): Not on file  Physical Activity:   . Days of Exercise per Week: Not on file  . Minutes of Exercise per Session: Not on file  Stress:   . Feeling of Stress : Not on file  Social Connections:   . Frequency of Communication with Friends and Family: Not on file  . Frequency of Social Gatherings with Friends and Family: Not on file  . Attends Religious Services: Not on file  . Active Member of Clubs or Organizations: Not on file  . Attends Archivist Meetings: Not on file  . Marital Status: Not on file  Intimate Partner Violence:   . Fear of Current or Ex-Partner: Not on file  . Emotionally Abused: Not on file  . Physically Abused: Not on file  . Sexually Abused: Not on file    Review of Systems  Constitutional: Negative.  Negative for chills and fever.  HENT: Negative.  Negative for congestion and sore throat.   Respiratory: Negative.  Negative for cough and shortness of breath.   Cardiovascular: Negative.  Negative for chest pain and palpitations.  Gastrointestinal: Negative for abdominal  pain, blood in stool, diarrhea, melena, nausea and vomiting.  Genitourinary: Negative.   Musculoskeletal: Negative.  Negative for back pain, myalgias and neck pain.  Skin: Negative.  Negative for rash.  Neurological: Negative.  Negative for dizziness and headaches.  Endo/Heme/Allergies: Negative.   All other systems reviewed and are negative.   Objective  Alert and oriented x3 in no apparent respiratory distress Vitals as reported by the patient: Today's Vitals   10/19/19 1600  Weight: 147 lb (66.7 kg)  Height: 4' 10.5" (1.486 m)    Emory was seen today for medication review.  Diagnoses and all orders for this visit:  Anxiety and depression -  buPROPion (WELLBUTRIN XL) 150 MG 24 hr tablet; Take 1 tablet (150 mg total) by mouth daily.     I discussed the assessment and treatment plan with the patient. The patient was provided an opportunity to ask questions and all were answered. The patient agreed with the plan and demonstrated an understanding of the instructions.   The patient was advised to call back or seek an in-person evaluation if the symptoms worsen or if the condition fails to improve as anticipated.  I provided 15 minutes of non-face-to-face time during this encounter.  Horald Pollen, MD  Primary Care at [facility]

## 2019-10-20 ENCOUNTER — Other Ambulatory Visit: Payer: Self-pay | Admitting: Obstetrics and Gynecology

## 2019-10-21 ENCOUNTER — Ambulatory Visit: Payer: Commercial Managed Care - PPO | Admitting: Obstetrics and Gynecology

## 2019-11-09 ENCOUNTER — Ambulatory Visit: Payer: Commercial Managed Care - PPO | Attending: Internal Medicine

## 2019-11-09 DIAGNOSIS — Z20822 Contact with and (suspected) exposure to covid-19: Secondary | ICD-10-CM

## 2019-11-10 LAB — NOVEL CORONAVIRUS, NAA: SARS-CoV-2, NAA: NOT DETECTED

## 2019-11-10 LAB — SARS-COV-2, NAA 2 DAY TAT

## 2019-11-11 ENCOUNTER — Other Ambulatory Visit: Payer: Self-pay | Admitting: Emergency Medicine

## 2019-11-11 DIAGNOSIS — F32A Depression, unspecified: Secondary | ICD-10-CM

## 2019-11-11 DIAGNOSIS — F329 Major depressive disorder, single episode, unspecified: Secondary | ICD-10-CM

## 2019-11-11 NOTE — Telephone Encounter (Signed)
Requested Prescriptions  Pending Prescriptions Disp Refills  . buPROPion (WELLBUTRIN XL) 150 MG 24 hr tablet [Pharmacy Med Name: BUPROPION HCL XL 150 MG TABLET] 90 tablet 1    Sig: TAKE 1 TABLET BY MOUTH EVERY DAY     Psychiatry: Antidepressants - bupropion Passed - 11/11/2019 10:33 AM      Passed - Completed PHQ-2 or PHQ-9 in the last 360 days.      Passed - Last BP in normal range    BP Readings from Last 1 Encounters:  10/07/19 104/68         Passed - Valid encounter within last 6 months    Recent Outpatient Visits          3 weeks ago Anxiety and depression   Primary [facility] at [facility], Ines Bloomer, MD   5 months ago Encounter to establish care   Primary Care at [facility], Ines Bloomer, MD   9 months ago Anxiety and depression   Primary Care at [place], Ines Bloomer, MD   1 year ago Muscle pain   Primary Care at [facility], Gelene Mink, PA-C   1 year ago Annual physical exam   Primary Care at [place], PA-C

## 2019-11-16 ENCOUNTER — Encounter: Payer: Self-pay | Admitting: Internal Medicine

## 2020-01-18 ENCOUNTER — Other Ambulatory Visit: Payer: Self-pay

## 2020-01-18 ENCOUNTER — Ambulatory Visit (AMBULATORY_SURGERY_CENTER): Payer: Self-pay | Admitting: *Deleted

## 2020-01-18 VITALS — Ht <= 58 in | Wt 146.0 lb

## 2020-01-18 DIAGNOSIS — Z1211 Encounter for screening for malignant neoplasm of colon: Secondary | ICD-10-CM

## 2020-01-18 DIAGNOSIS — Z01818 Encounter for other preprocedural examination: Secondary | ICD-10-CM

## 2020-01-18 NOTE — Progress Notes (Signed)
Patient is here in-person for PV. Patient denies any allergies to eggs or soy. Patient denies any problems with anesthesia/sedation. Patient denies any oxygen use at home. Patient denies taking any diet/weight loss medications or blood thinners. Patient is not being treated for MRSA or C-diff. Patient is aware of our care-partner policy and [id number] safety protocol. EMMI education assisgned to the patient for the procedure, this was explained and instructions given to patient.   COVID-19 screening test is on 6/17, the pt is aware.

## 2020-01-22 ENCOUNTER — Encounter: Payer: Self-pay | Admitting: Internal Medicine

## 2020-01-26 DIAGNOSIS — E6609 Other obesity due to excess calories: Secondary | ICD-10-CM | POA: Insufficient documentation

## 2020-01-26 DIAGNOSIS — A6004 Herpesviral vulvovaginitis: Secondary | ICD-10-CM | POA: Insufficient documentation

## 2020-01-26 DIAGNOSIS — J4540 Moderate persistent asthma, uncomplicated: Secondary | ICD-10-CM | POA: Insufficient documentation

## 2020-01-26 DIAGNOSIS — E7800 Pure hypercholesterolemia, unspecified: Secondary | ICD-10-CM | POA: Insufficient documentation

## 2020-01-26 DIAGNOSIS — E663 Overweight: Secondary | ICD-10-CM | POA: Insufficient documentation

## 2020-01-26 DIAGNOSIS — M545 Low back pain, unspecified: Secondary | ICD-10-CM | POA: Insufficient documentation

## 2020-01-26 DIAGNOSIS — G8929 Other chronic pain: Secondary | ICD-10-CM | POA: Insufficient documentation

## 2020-01-28 ENCOUNTER — Other Ambulatory Visit: Payer: Self-pay | Admitting: Internal Medicine

## 2020-01-28 ENCOUNTER — Ambulatory Visit (INDEPENDENT_AMBULATORY_CARE_PROVIDER_SITE_OTHER): Payer: Commercial Managed Care - PPO

## 2020-01-28 DIAGNOSIS — Z1159 Encounter for screening for other viral diseases: Secondary | ICD-10-CM

## 2020-01-28 LAB — SARS CORONAVIRUS 2 (TAT 6-24 HRS): SARS Coronavirus 2: NEGATIVE

## 2020-02-01 ENCOUNTER — Other Ambulatory Visit: Payer: Self-pay

## 2020-02-01 ENCOUNTER — Ambulatory Visit (AMBULATORY_SURGERY_CENTER): Payer: Commercial Managed Care - PPO | Admitting: Internal Medicine

## 2020-02-01 ENCOUNTER — Encounter: Payer: Self-pay | Admitting: Internal Medicine

## 2020-02-01 VITALS — BP 127/78 | HR 66 | Temp 96.0°F | Resp 11 | Ht <= 58 in | Wt 146.0 lb

## 2020-02-01 DIAGNOSIS — Z1211 Encounter for screening for malignant neoplasm of colon: Secondary | ICD-10-CM | POA: Diagnosis present

## 2020-02-01 MED ORDER — SODIUM CHLORIDE 0.9 % IV SOLN: 500.0000 mL | Freq: Once | INTRAVENOUS | Status: DC

## 2020-02-01 NOTE — Progress Notes (Signed)
VS by CW  Pt's states no medical or surgical changes since previsit or office visit.  

## 2020-02-01 NOTE — Progress Notes (Signed)
PT taken to PACU. Monitors in place. VSS. Report given to RN. 

## 2020-02-01 NOTE — Patient Instructions (Addendum)
The colonoscopy was normal. No polyps or cancer seen.  Next routine colonoscopy or other screening test in 10 years - 2031  I appreciate the opportunity to care for you. Gatha Mayer, MD, FACG  YOU HAD AN ENDOSCOPIC PROCEDURE TODAY AT [facility]:   Refer to the procedure report that was given to you for any specific questions about what was found during the examination.  If the procedure report does not answer your questions, please call your gastroenterologist to clarify.  If you requested that your care partner not be given the details of your procedure findings, then the procedure report has been included in a sealed envelope for you to review at your convenience later.  YOU SHOULD EXPECT: Some feelings of bloating in the abdomen. Passage of more gas than usual.  Walking can help get rid of the air that was put into your GI tract during the procedure and reduce the bloating. If you had a lower endoscopy (such as a colonoscopy or flexible sigmoidoscopy) you may notice spotting of blood in your stool or on the toilet paper. If you underwent a bowel prep for your procedure, you may not have a normal bowel movement for a few days.  Please Note:  You might notice some irritation and congestion in your nose or some drainage.  This is from the oxygen used during your procedure.  There is no need for concern and it should clear up in a day or so.  SYMPTOMS TO REPORT IMMEDIATELY:   Following lower endoscopy (colonoscopy or flexible sigmoidoscopy):  Excessive amounts of blood in the stool  Significant tenderness or worsening of abdominal pains  Swelling of the abdomen that is new, acute  Fever of 100F or higher  For urgent or emergent issues, a gastroenterologist can be reached at any hour by calling [phone]. Do not use MyChart messaging for urgent concerns.    DIET:  We do recommend a small meal at first, but then you may proceed to your regular diet.  Drink plenty  of fluids but you should avoid alcoholic beverages for 24 hours.  ACTIVITY:  You should plan to take it easy for the rest of today and you should NOT DRIVE or use heavy machinery until tomorrow (because of the sedation medicines used during the test).    FOLLOW UP: Our staff will call the number listed on your records 48-72 hours following your procedure to check on you and address any questions or concerns that you may have regarding the information given to you following your procedure. If we do not reach you, we will leave a message.  We will attempt to reach you two times.  During this call, we will ask if you have developed any symptoms of COVID 19. If you develop any symptoms (ie: fever, flu-like symptoms, shortness of breath, cough etc.) before then, please call [phone].  If you test positive for Covid 19 in the 2 weeks post procedure, please call and report this information to [place].    If any biopsies were taken you will be contacted by phone or by letter within the next 1-3 weeks.  Please call us at [phone] if you have not heard about the biopsies in 3 weeks.    SIGNATURES/CONFIDENTIALITY: You and/or your care partner have signed paperwork which will be entered into your electronic medical record.  These signatures attest to the fact that that the information above on your After Visit Summary has been reviewed and  is understood.  Full responsibility of the confidentiality of this discharge information lies with you and/or your care-partner.

## 2020-02-01 NOTE — Op Note (Signed)
[facility] Patient Name: Kathleen Craig Procedure Date: 02/01/2020 9:44 AM MRN: [id number] Endoscopist: Gatha Mayer , MD Age: 54 Referring MD:  Date of Birth: [birth year] Gender: Female Account #: [id number] Procedure:                Colonoscopy Indications:              Screening for colorectal malignant neoplasm, This                            is the patient's first colonoscopy Medicines:                Propofol per Anesthesia, Monitored Anesthesia Care Procedure:                Pre-Anesthesia Assessment:                           - Prior to the procedure, a History and Physical                            was performed, and patient medications and                            allergies were reviewed. The patient's tolerance of                            previous anesthesia was also reviewed. The risks                            and benefits of the procedure and the sedation                            options and risks were discussed with the patient.                            All questions were answered, and informed consent                            was obtained. Prior Anticoagulants: The patient has                            taken no previous anticoagulant or antiplatelet                            agents. ASA Grade Assessment: II - A patient with                            mild systemic disease. After reviewing the risks                            and benefits, the patient was deemed in                            satisfactory condition to undergo the procedure.  After obtaining informed consent, the colonoscope                            was passed under direct vision. Throughout the                            procedure, the patient's blood pressure, pulse, and                            oxygen saturations were monitored continuously. The                            Colonoscope was introduced through the anus and                             advanced to the the cecum, identified by                            appendiceal orifice and ileocecal valve. The                            quality of the bowel preparation was excellent. The                            colonoscopy was performed without difficulty. The                            patient tolerated the procedure well. The bowel                            preparation used was Miralax via split dose                            instruction. Scope In: 9:48:24 AM Scope Out: 9:57:20 AM Scope Withdrawal Time: 0 hours 7 minutes 3 seconds  Total Procedure Duration: 0 hours 8 minutes 56 seconds  Findings:                 The perianal and digital rectal examinations were                            normal.                           The colon (entire examined portion) appeared normal.                           No additional abnormalities were found on                            retroflexion. Complications:            No immediate complications. Estimated blood loss:                            None. Estimated Blood Loss:     Estimated  blood loss: none. Impression:               - The entire examined colon is normal.                           - No specimens collected. Recommendation:           - Repeat colonoscopy in 10 years for screening                            purposes.                           - Patient has a contact number available for                            emergencies. The signs and symptoms of potential                            delayed complications were discussed with the                            patient. Return to normal activities tomorrow.                            Written discharge instructions were provided to the                            patient.                           - Resume previous diet.                           - Continue present medications. Gatha Mayer, MD 02/01/2020 10:02:53 AM This report has been signed electronically.

## 2020-02-03 ENCOUNTER — Telehealth: Payer: Self-pay | Admitting: *Deleted

## 2020-02-03 NOTE — Telephone Encounter (Signed)
  Follow up Call-  Call back number 02/01/2020  Post procedure Call Back phone  # [phone]  Permission to leave phone message Yes  Some recent data might be hidden     Patient questions:  Do you have a fever, pain , or abdominal swelling? No. Pain Score  0 *  Have you tolerated food without any problems? Yes.    Have you been able to return to your normal activities? Yes.    Do you have any questions about your discharge instructions: Diet   No. Medications  No. Follow up visit  No.  Do you have questions or concerns about your Care? No.  Actions: * If pain score is 4 or above: No action needed, pain <4. 1. Have you developed a fever since your procedure? no  2.   Have you had an respiratory symptoms (SOB or cough) since your procedure? no  3.   Have you tested positive for COVID 19 since your procedure no  4.   Have you had any family members/close contacts diagnosed with the COVID 19 since your procedure?  no   If yes to any of these questions please route to Joylene John, RN and Erenest Rasher, RN

## 2020-03-21 IMAGING — MG DIGITAL SCREENING BILATERAL MAMMOGRAM WITH CAD
4 series · 4 of 4 positions shown · non-contrast
Comparison: Previous exam(s).

CLINICAL DATA: Screening.

EXAM:
DIGITAL SCREENING BILATERAL MAMMOGRAM WITH CAD

[L CC]
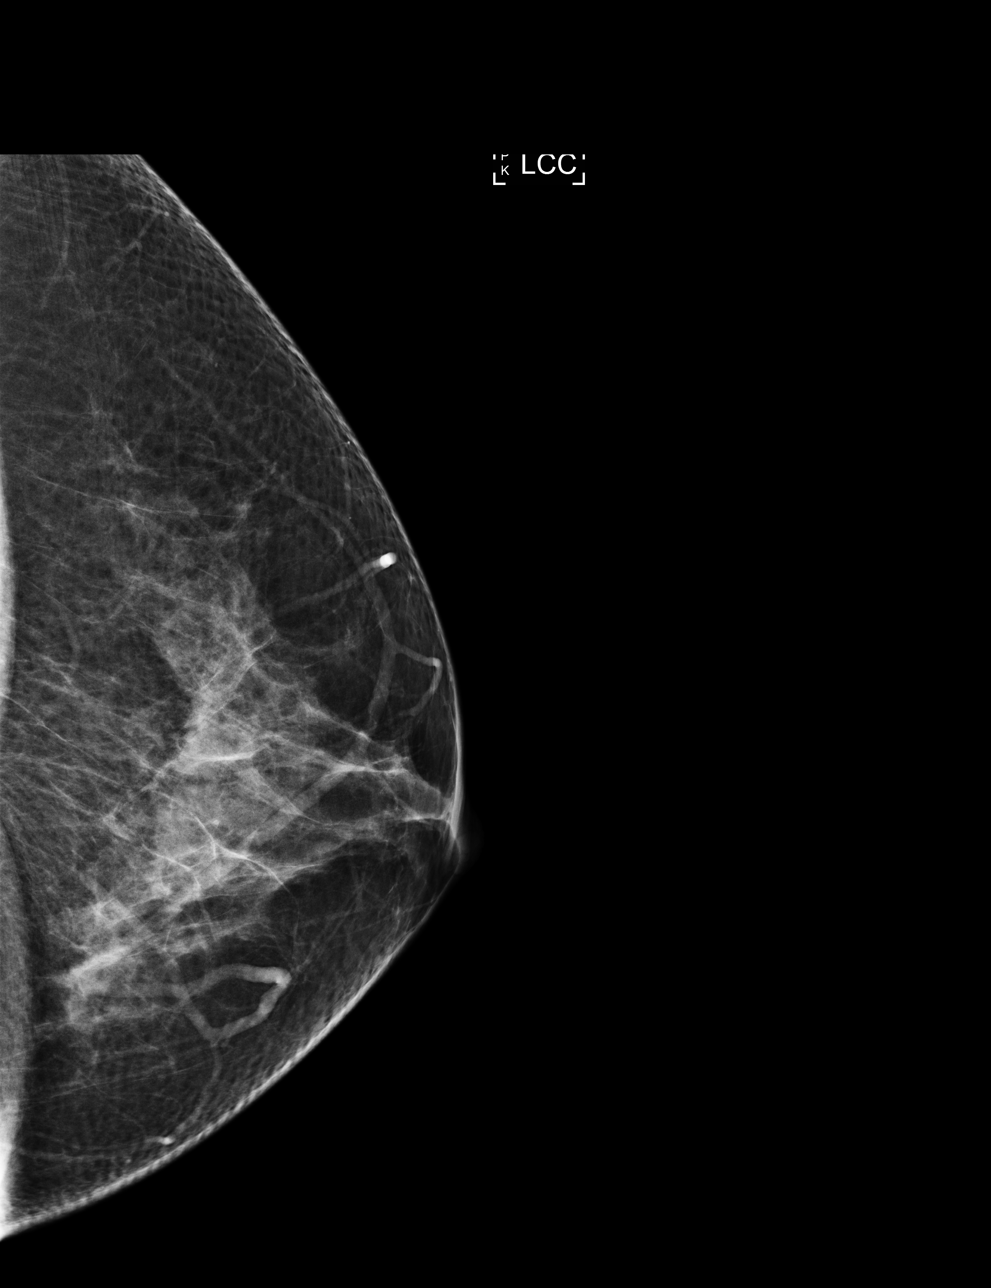

[L MLO]
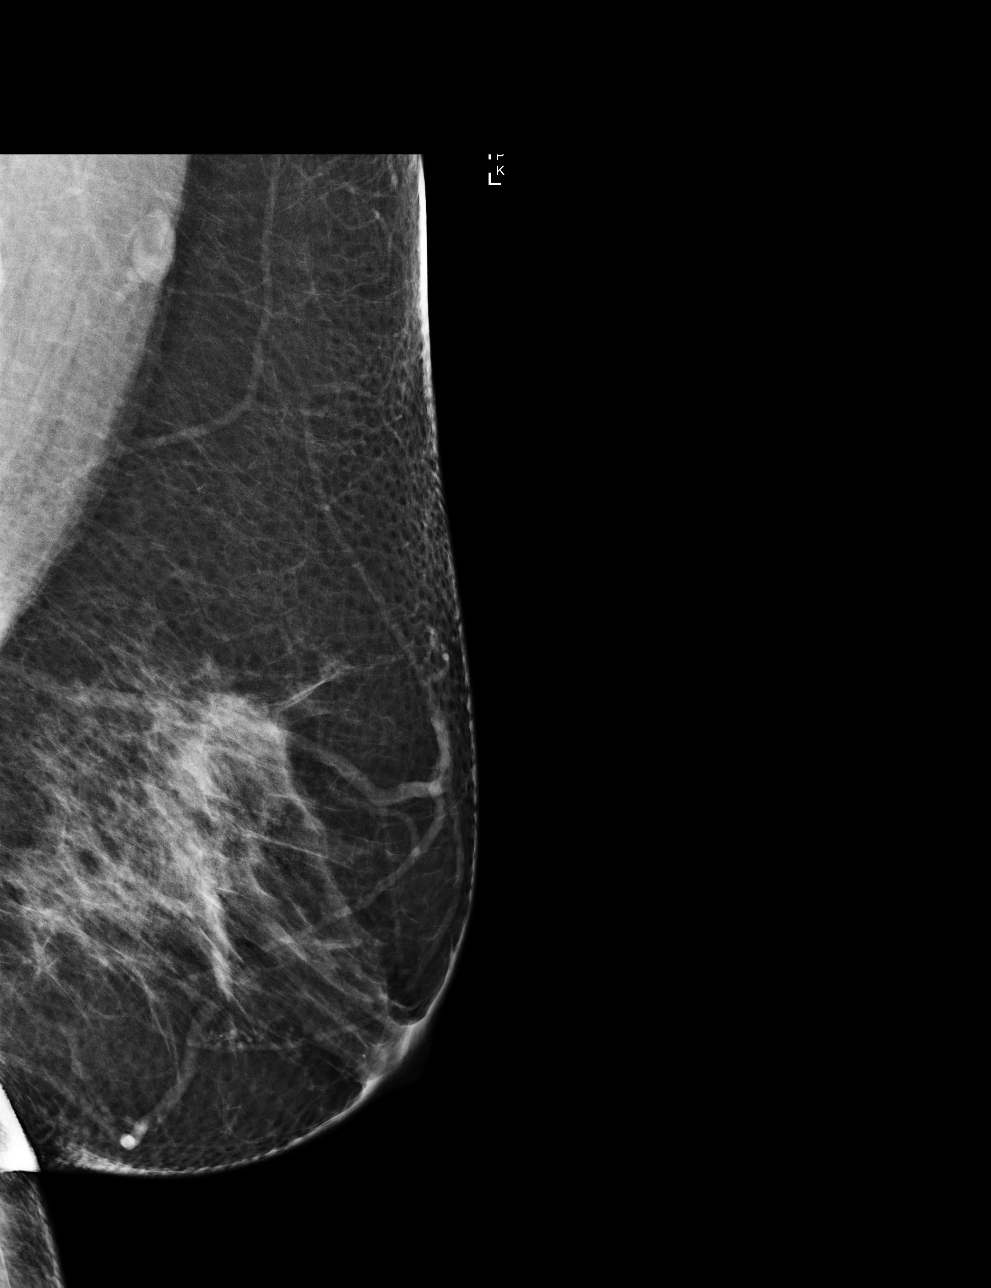

[R MLO]
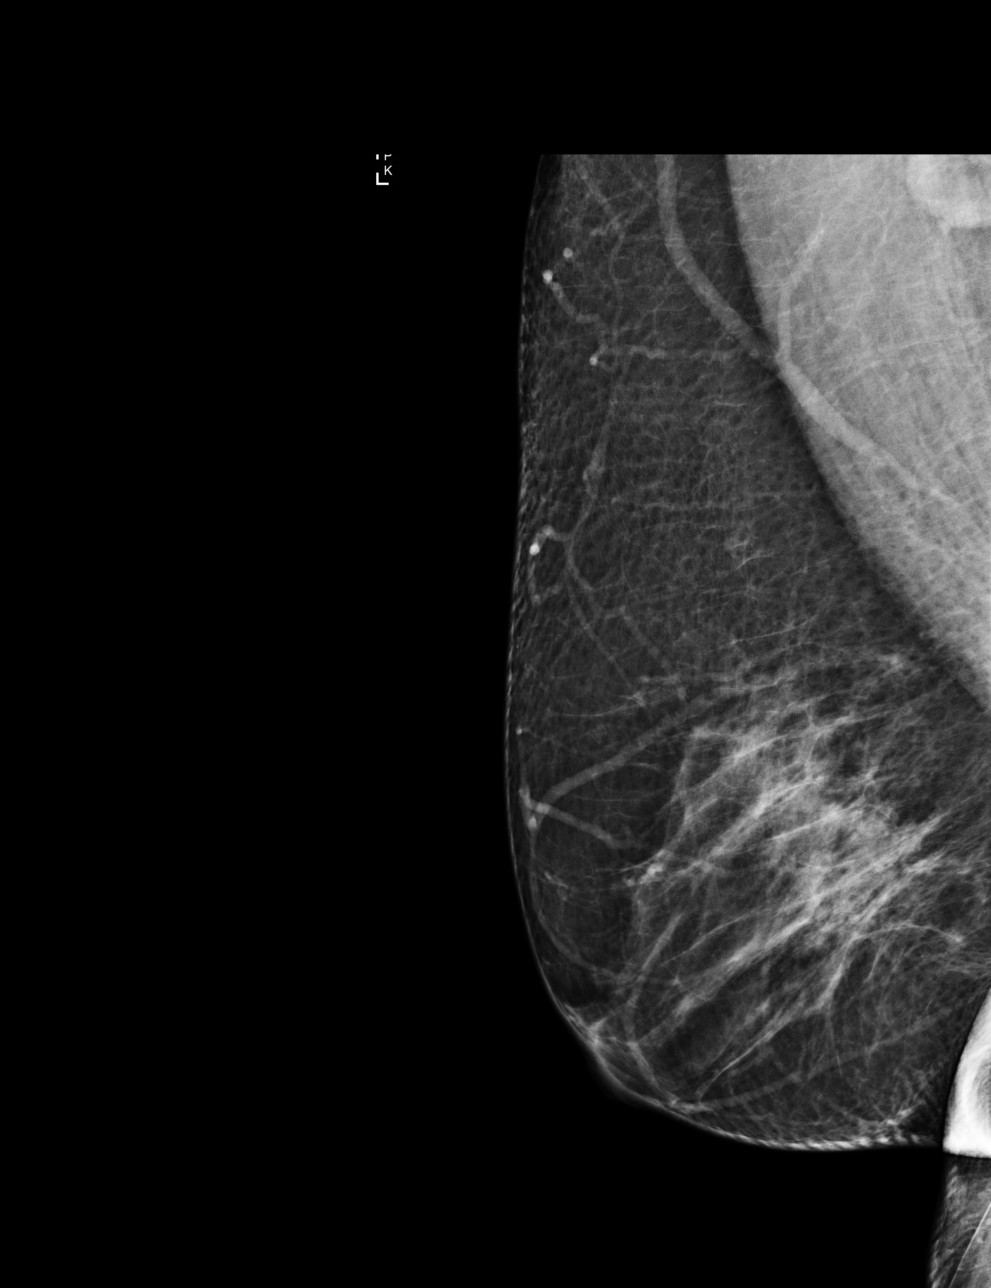

[R CC]
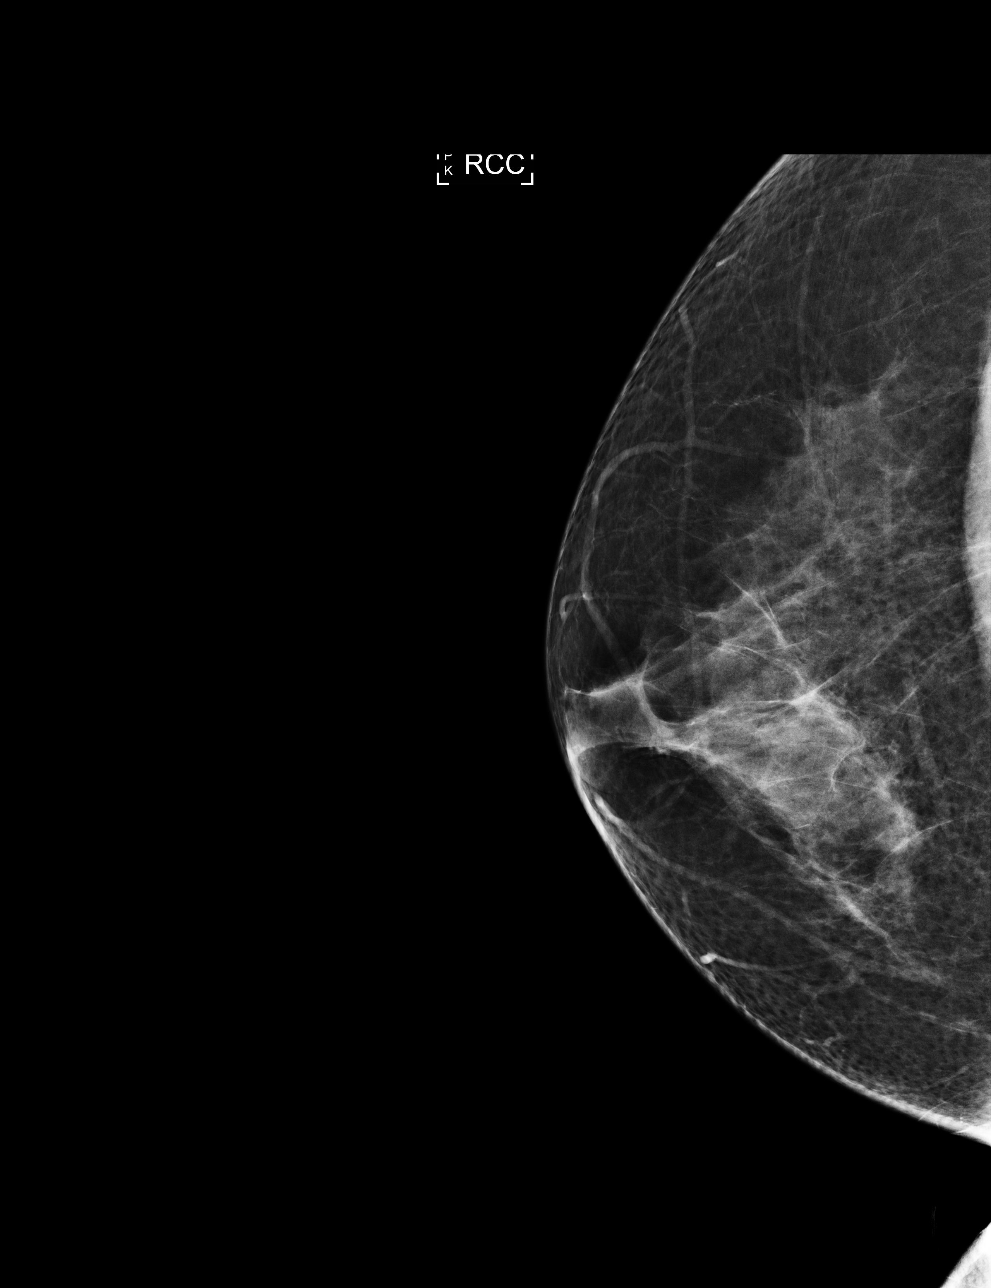

[4 of 4 positions shown; findings below may reference images not displayed]

ACR Breast Density Category c: The breast tissue is heterogeneously
dense, which may obscure small masses.
FINDINGS: There are no findings suspicious for malignancy. Images were
processed with CAD.
IMPRESSION: No mammographic evidence of malignancy. A result letter of this
screening mammogram will be mailed directly to the patient.

RECOMMENDATION:
Screening mammogram in one year. (Code:YJ-[id number])

BI-RADS CATEGORY  1: Negative.

## 2020-05-19 DIAGNOSIS — F330 Major depressive disorder, recurrent, mild: Secondary | ICD-10-CM | POA: Insufficient documentation

## 2020-10-10 NOTE — Progress Notes (Signed)
55 y.o. G55P1001 Married White or Caucasian Not Hispanic or Latino female here for annual exam.      She c/o a couple of vulvar itching. No change in discharge.   LMP 4/21, the year prior was irregular. She has mild vasomotor symptoms. Intermittent vaginal dryness. No dyspareunia.   H/O HSV takes valtrex every other day. Gets ~3 outbreaks a year.            Sexually active: Yes.    The current method of family planning is post menopausal status.    Exercising: Yes.    walking Smoker:  no  Health Maintenance: Pap:  08/22/2017 WNL NEG HPV,10/2016 Neg History of abnormal Pap:  yes LEEP in 2000 MMG:  10/19/19 Density C Bi-rads 1 neg  BMD:   11/13/16 normal: Prior -Hx Osteopenia and hx of Boniva.She was on prednisone secondary to asthma.  Colonoscopy: 02/01/20 f/u 10 years TDaP:  05/26/15 Gardasil: NA    reports that she has never smoked. She has never used smokeless tobacco. She reports previous alcohol use. She reports that she does not use drugs. Just occasional ETOH. She is an Web designer. Daughter is 75, lives 2 miles away.   Past Medical History:  Diagnosis Date  . Abnormal Pap smear of cervix 2000   Hx of LEEP and paps normal since  . Allergy   . Anxiety   . Asthma   . Cancer ([place]) 2016, 2010   basal cell on head and collarbone  . Depression   . Hx of osteopenia   . STD (sexually transmitted disease)    HSV II    Past Surgical History:  Procedure Laterality Date  . CERVICAL BIOPSY  W/ LOOP ELECTRODE EXCISION  2000   in [place]  . SKIN CANCER EXCISION      Current Outpatient Medications  Medication Sig Dispense Refill  . escitalopram (LEXAPRO) 20 MG tablet Take 1 tablet (20 mg total) by mouth daily. 90 tablet 4  . Ferrous Sulfate (IRON PO) Take by mouth.    . Fluticasone-Salmeterol (ADVAIR DISKUS) 100-50 MCG/DOSE AEPB Inhale 1 puff into the lungs 2 (two) times daily. 180 each 4  . metaxalone (SKELAXIN) 800 MG tablet Take 0.5-1 tablets (400-800 mg total) by mouth  3 (three) times daily. Take only as needed. 90 tablet 1  . Multiple Vitamin (MULTIVITAMIN) capsule Take 1 capsule by mouth daily.    . Omega-3 Fatty Acids (FISH OIL) 1000 MG CAPS Take by mouth.    . pantoprazole (PROTONIX) 40 MG tablet Take 1 tablet (40 mg total) by mouth daily. 90 tablet 4  . valACYclovir (VALTREX) 500 MG tablet TAKE 1 TABLET BY MOUTH EVERY DAY, INCREASE TO 1 TABLET TWICE DAILY FOR 3 DAYS FOR OUTBREAK 90 tablet 4   No current facility-administered medications for this visit.    Family History  Problem Relation Age of Onset  . Heart disease Maternal Grandfather   . Hypertension Maternal Grandfather   . Diabetes Mother        diet controlled  . Hyperlipidemia Mother   . Hypertension Mother   . Hyperlipidemia Father   . Alzheimer's disease Paternal Grandfather   . Cancer Other   . Colon cancer Neg Hx   . Esophageal cancer Neg Hx   . Rectal cancer Neg Hx   . Stomach cancer Neg Hx   . Colon polyps Neg Hx     Review of Systems  All other systems reviewed and are negative.   Exam:  BP 100/68   Pulse 74   Ht 4' 10.5" (1.486 m)   Wt 143 lb (64.9 kg)   LMP 05/12/2018 (Exact Date)   SpO2 96%   BMI 29.38 kg/m   Weight change: @WEIGHTCHANGE @ Height:   Height: 4' 10.5" (148.6 cm)  Ht Readings from Last 3 Encounters:  10/12/20 4' 10.5" (1.486 m)  02/01/20 4' 9.25" (1.454 m)  01/18/20 4' 9.25" (1.454 m)    General appearance: alert, cooperative and appears stated age Head: Normocephalic, without obvious abnormality, atraumatic Neck: no adenopathy, supple, symmetrical, trachea midline and thyroid normal to inspection and palpation Lungs: clear to auscultation bilaterally Cardiovascular: regular rate and rhythm Breasts: normal appearance, no masses or tenderness Abdomen: soft, non-tender; non distended,  no masses,  no organomegaly Extremities: extremities normal, atraumatic, no cyanosis or edema Skin: Skin color, texture, turgor normal. No rashes or  lesions Lymph nodes: Cervical, supraclavicular, and axillary nodes normal. No abnormal inguinal nodes palpated Neurologic: Grossly normal   Pelvic: External genitalia:  Atrophic, mild whitening above the clitoris, mild agglutination              Urethra:  normal appearing urethra with no masses, tenderness or lesions              Bartholins and Skenes: normal                 Vagina: atropic appearing vagina with a slight increase in watery vaginal discharge.              Cervix: no lesions and stenotic               Bimanual Exam:  Uterus:  normal size, contour, position, consistency, mobility, non-tender              Adnexa: no mass, fullness, tenderness               Rectovaginal: Confirms               Anus:  normal sphincter tone, no lesions   Wet prep:     1. Well woman exam Discussed breast self exam Discussed calcium and vit D intake Mammogram due, she will schedule Colonoscopy UTD Labs with primary  2. Screening for cervical cancer - Cytology - PAP with hpv  3. Genital herpes simplex, unspecified site  - valACYclovir (VALTREX) 500 MG tablet; TAKE 1 TABLET BY MOUTH EVERY DAY, INCREASE TO 1 TABLET TWICE DAILY FOR 3 DAYS FOR OUTBREAK  Dispense: 90 tablet; Refill: 4  4. Acute vulvitis - WET PREP FOR TRICH, YEAST, CLUE -vulvar skin care information given - betamethasone valerate ointment (VALISONE) 0.1 %; Apply 1 application topically 2 (two) times daily. Can use for up to 2 weeks.  Dispense: 15 g; Refill: 0 - SureSwab Bacterial Vaginosis/itis

## 2020-10-12 ENCOUNTER — Other Ambulatory Visit (HOSPITAL_COMMUNITY)
Admission: RE | Admit: 2020-10-12 | Discharge: 2020-10-12 | Disposition: A | Discharge status: Home / Self Care | Payer: Commercial Managed Care - PPO | Source: Ambulatory Visit | Attending: Obstetrics and Gynecology | Admitting: Obstetrics and Gynecology

## 2020-10-12 ENCOUNTER — Encounter: Payer: Self-pay | Admitting: Obstetrics and Gynecology

## 2020-10-12 ENCOUNTER — Ambulatory Visit (INDEPENDENT_AMBULATORY_CARE_PROVIDER_SITE_OTHER): Payer: Commercial Managed Care - PPO | Admitting: Obstetrics and Gynecology

## 2020-10-12 ENCOUNTER — Other Ambulatory Visit: Payer: Self-pay

## 2020-10-12 VITALS — BP 100/68 | HR 74 | Ht 58.5 in | Wt 143.0 lb

## 2020-10-12 DIAGNOSIS — N762 Acute vulvitis: Secondary | ICD-10-CM | POA: Diagnosis not present

## 2020-10-12 DIAGNOSIS — A6000 Herpesviral infection of urogenital system, unspecified: Secondary | ICD-10-CM

## 2020-10-12 DIAGNOSIS — Z124 Encounter for screening for malignant neoplasm of cervix: Secondary | ICD-10-CM | POA: Diagnosis not present

## 2020-10-12 DIAGNOSIS — Z01419 Encounter for gynecological examination (general) (routine) without abnormal findings: Secondary | ICD-10-CM

## 2020-10-12 LAB — WET PREP FOR TRICH, YEAST, CLUE

## 2020-10-12 MED ORDER — VALACYCLOVIR HCL 500 MG PO TABS: ORAL_TABLET | ORAL | 4 refills | Status: DC

## 2020-10-12 MED ORDER — BETAMETHASONE VALERATE 0.1 % EX OINT: 1.0000 "application " | TOPICAL_OINTMENT | Freq: Two times a day (BID) | CUTANEOUS | 0 refills | Status: DC

## 2020-10-12 NOTE — Patient Instructions (Signed)
EXERCISE   We recommended that you start or continue a regular exercise program for good health. Physical activity is anything that gets your body moving, some is better than none. The CDC recommends 150 minutes per week of Moderate-Intensity Aerobic Activity and 2 or more days of Muscle Strengthening Activity.  Benefits of exercise are limitless: helps weight loss/weight maintenance, improves mood and energy, helps with depression and anxiety, improves sleep, tones and strengthens muscles, improves balance, improves bone density, protects from chronic conditions such as heart disease, high blood pressure and diabetes and so much more. To learn more visit: https://www.cdc.gov/physicalactivity/index.html  DIET: Good nutrition starts with a healthy diet of fruits, vegetables, whole grains, and lean protein sources. Drink plenty of water for hydration. Minimize empty calories, sodium, sweets. For more information about dietary recommendations visit: https://health.gov/our-work/nutrition-physical-activity/dietary-guidelines and https://www.myplate.gov/  ALCOHOL:  Women should limit their alcohol intake to no more than 7 drinks/beers/glasses of wine (combined, not each!) per week. Moderation of alcohol intake to this level decreases your risk of breast cancer and liver damage.  If you are concerned that you may have a problem, or your friends have told you they are concerned about your drinking, there are many resources to help. A well-known program that is free, effective, and available to all people all over the nation is Alcoholics Anonymous.  Check out this site to learn more: https://www.aa.org/   CALCIUM AND VITAMIN D:  Adequate intake of calcium and Vitamin D are recommended for bone health.  You should be getting between 1000-1200 mg of calcium and 800 units of Vitamin D daily between diet and supplements  PAP SMEARS:  Pap smears, to check for cervical cancer or precancers,  have traditionally been  done yearly, scientific advances have shown that most women can have pap smears less often.  However, every woman still should have a physical exam from her gynecologist every year. It will include a breast check, inspection of the vulva and vagina to check for abnormal growths or skin changes, a visual exam of the cervix, and then an exam to evaluate the size and shape of the uterus and ovaries. We will also provide age appropriate advice regarding health maintenance, like when you should have certain vaccines, screening for sexually transmitted diseases, bone density testing, colonoscopy, mammograms, etc.   MAMMOGRAMS:  All women over 40 years old should have a routine mammogram.   COLON CANCER SCREENING: Now recommend starting at age 45. At this time colonoscopy is not covered for routine screening until 50. There are take home tests that can be done between 45-49.   COLONOSCOPY:  Colonoscopy to screen for colon cancer is recommended for all women at age 50.  We know, you hate the idea of the prep.  We agree, BUT, having colon cancer and not knowing it is worse!!  Colon cancer so often starts as a polyp that can be seen and removed at colonscopy, which can quite literally save your life!  And if your first colonoscopy is normal and you have no family history of colon cancer, most women don't have to have it again for 10 years.  Once every ten years, you can do something that may end up saving your life, right?  We will be happy to help you get it scheduled when you are ready.  Be sure to check your insurance coverage so you understand how much it will cost.  It may be covered as a preventative service at no cost, but you should check   your particular policy.      Breast Self-Awareness Breast self-awareness means being familiar with how your breasts look and feel. It involves checking your breasts regularly and reporting any changes to your health care provider. Practicing breast self-awareness is  important. A change in your breasts can be a sign of a serious medical problem. Being familiar with how your breasts look and feel allows you to find any problems early, when treatment is more likely to be successful. All women should practice breast self-awareness, including women who have had breast implants. How to do a breast self-exam One way to learn what is normal for your breasts and whether your breasts are changing is to do a breast self-exam. To do a breast self-exam: Look for Changes  1. Remove all the clothing above your waist. 2. Stand in front of a mirror in a room with good lighting. 3. Put your hands on your hips. 4. Push your hands firmly downward. 5. Compare your breasts in the mirror. Look for differences between them (asymmetry), such as: ? Differences in shape. ? Differences in size. ? Puckers, dips, and bumps in one breast and not the other. 6. Look at each breast for changes in your skin, such as: ? Redness. ? Scaly areas. 7. Look for changes in your nipples, such as: ? Discharge. ? Bleeding. ? Dimpling. ? Redness. ? A change in position. Feel for Changes Carefully feel your breasts for lumps and changes. It is best to do this while lying on your back on the floor and again while sitting or standing in the shower or tub with soapy water on your skin. Feel each breast in the following way:  Place the arm on the side of the breast you are examining above your head.  Feel your breast with the other hand.  Start in the nipple area and make  inch (2 cm) overlapping circles to feel your breast. Use the pads of your three middle fingers to do this. Apply light pressure, then medium pressure, then firm pressure. The light pressure will allow you to feel the tissue closest to the skin. The medium pressure will allow you to feel the tissue that is a little deeper. The firm pressure will allow you to feel the tissue close to the ribs.  Continue the overlapping circles,  moving downward over the breast until you feel your ribs below your breast.  Move one finger-width toward the center of the body. Continue to use the  inch (2 cm) overlapping circles to feel your breast as you move slowly up toward your collarbone.  Continue the up and down exam using all three pressures until you reach your armpit.  Write Down What You Find  Write down what is normal for each breast and any changes that you find. Keep a written record with breast changes or normal findings for each breast. By writing this information down, you do not need to depend only on memory for size, tenderness, or location. Write down where you are in your menstrual cycle, if you are still menstruating. If you are having trouble noticing differences in your breasts, do not get discouraged. With time you will become more familiar with the variations in your breasts and more comfortable with the exam. How often should I examine my breasts? Examine your breasts every month. If you are breastfeeding, the best time to examine your breasts is after a feeding or after using a breast pump. If you menstruate, the best time to   examine your breasts is 5-7 days after your period is over. During your period, your breasts are lumpier, and it may be more difficult to notice changes. When should I see my health care provider? See your health care provider if you notice:  A change in shape or size of your breasts or nipples.  A change in the skin of your breast or nipples, such as a reddened or scaly area.  Unusual discharge from your nipples.  A lump or thick area that was not there before.  Pain in your breasts.  Anything that concerns you.  Menopause Menopause is the normal time of a woman's life when menstrual periods stop completely. It marks the natural end to a woman's ability to become pregnant. It can be defined as the absence of a menstrual period for 12 months without another medical cause. The  transition to menopause (perimenopause) most often happens between the ages of 73 and 81, and can last for many years. During perimenopause, hormone levels change in your body, which can cause symptoms and affect your health. Menopause may increase your risk for:  Weakened bones (osteoporosis), which causes fractures.  Depression.  Hardening and narrowing of the arteries (atherosclerosis), which can cause heart attacks and strokes. What are the causes? This condition is usually caused by a natural change in hormone levels that happens as you get older. The condition may also be caused by changes that are not natural, including:  Surgery to remove both ovaries (surgical menopause).  Side effects from some medicines, such as chemotherapy used to treat cancer (chemical menopause). What increases the risk? This condition is more likely to start at an earlier age if you have certain medical conditions or have undergone treatments, including:  A tumor of the pituitary gland in the brain.  A disease that affects the ovaries and hormones.  Certain cancer treatments, such as chemotherapy or hormone therapy, or radiation therapy on the pelvis.  Heavy smoking and excessive alcohol use.  Family history of early menopause. This condition is also more likely to develop earlier in women who are very thin. What are the signs or symptoms? Symptoms of this condition include:  Hot flashes.  Irregular menstrual periods.  Night sweats.  Changes in feelings about sex. This could be a decrease in sex drive or an increased discomfort around your sexuality.  Vaginal dryness and thinning of the vaginal walls. This may cause painful sex.  Dryness of the skin and development of wrinkles.  Headaches.  Problems sleeping (insomnia).  Mood swings or irritability.  Memory problems.  Weight gain.  Hair growth on the face and chest.  Bladder infections or problems with urinating. How is this  diagnosed? This condition is diagnosed based on your medical history, a physical exam, your age, your menstrual history, and your symptoms. Hormone tests may also be done. How is this treated? In some cases, no treatment is needed. You and your health care provider should make a decision together about whether treatment is necessary. Treatment will be based on your individual condition and preferences. Treatment for this condition focuses on managing symptoms. Treatment may include:  Menopausal hormone therapy (MHT).  Medicines to treat specific symptoms or complications.  Acupuncture.  Vitamin or herbal supplements. Before starting treatment, make sure to let your health care provider know if you have a personal or family history of these conditions:  Heart disease.  Breast cancer.  Blood clots.  Diabetes.  Osteoporosis. Follow these instructions at home: Lifestyle  Do not use any products that contain nicotine or tobacco, such as cigarettes, e-cigarettes, and chewing tobacco. If you need help quitting, ask your health care provider.  Get at least 30 minutes of physical activity on 5 or more days each week.  Avoid alcoholic and caffeinated beverages, as well as spicy foods. This may help prevent hot flashes.  Get 7-8 hours of sleep each night.  If you have hot flashes, try: ? Dressing in layers. ? Avoiding things that may trigger hot flashes, such as spicy food, warm places, or stress. ? Taking slow, deep breaths when a hot flash starts. ? Keeping a fan in your home and office.  Find ways to manage stress, such as deep breathing, meditation, or journaling.  Consider going to group therapy with other women who are having menopause symptoms. Ask your health care provider about recommended group therapy meetings. Eating and drinking  Eat a healthy, balanced diet that contains whole grains, lean protein, low-fat dairy, and plenty of fruits and vegetables.  Your health care  provider may recommend adding more soy to your diet. Foods that contain soy include tofu, tempeh, and soy milk.  Eat plenty of foods that contain calcium and vitamin D for bone health. Items that are rich in calcium include low-fat milk, yogurt, beans, almonds, sardines, broccoli, and kale.   Medicines  Take over-the-counter and prescription medicines only as told by your health care provider.  Talk with your health care provider before starting any herbal supplements. If prescribed, take vitamins and supplements as told by your health care provider. General instructions  Keep track of your menstrual periods, including: ? When they occur. ? How heavy they are and how long they last. ? How much time passes between periods.  Keep track of your symptoms, noting when they start, how often you have them, and how long they last.  Use vaginal lubricants or moisturizers to help with vaginal dryness and improve comfort during sex.  Keep all follow-up visits. This is important. This includes any group therapy or counseling.   Contact a health care provider if:  You are still having menstrual periods after age 32.  You have pain during sex.  You have not had a period for 12 months and you develop vaginal bleeding. Get help right away if you have:  Severe depression.  Excessive vaginal bleeding.  Pain when you urinate.  A fast or irregular heartbeat (palpitations).  Severe headaches.  Abdominal pain or severe indigestion. Summary  Menopause is a normal time of life when menstrual periods stop completely. It is usually defined as the absence of a menstrual period for 12 months without another medical cause.  The transition to menopause (perimenopause) most often happens between the ages of 78 and 67 and can last for several years.  Symptoms can be managed through medicines, lifestyle changes, and complementary therapies such as acupuncture.  Eat a balanced diet that is rich in  nutrients to promote bone health and heart health and to manage symptoms during menopause. This information is not intended to replace advice given to you by your health care provider. Make sure you discuss any questions you have with your health care provider. Document Revised: 04/29/2020 Document Reviewed: 01/14/2020 Elsevier Patient Education  [place].

## 2020-10-14 LAB — CYTOLOGY - PAP
Comment: NEGATIVE
Diagnosis: NEGATIVE
High risk HPV: NEGATIVE

## 2020-10-19 LAB — SURESWAB BACTERIAL VAGINOSIS/ITIS
Atopobium vaginae: NOT DETECTED Log (cells/mL)
C. albicans, DNA: NOT DETECTED
C. glabrata, DNA: NOT DETECTED
C. parapsilosis, DNA: NOT DETECTED
C. tropicalis, DNA: NOT DETECTED
Gardnerella vaginalis: <4.7 Log (cells/mL)
LACTOBACILLUS SPECIES: NOT DETECTED Log (cells/mL)
MEGASPHAERA SPECIES: NOT DETECTED Log (cells/mL)
Trichomonas vaginalis RNA: NOT DETECTED

## 2020-10-31 ENCOUNTER — Other Ambulatory Visit: Payer: Self-pay | Admitting: Obstetrics and Gynecology

## 2020-10-31 DIAGNOSIS — Z1231 Encounter for screening mammogram for malignant neoplasm of breast: Secondary | ICD-10-CM

## 2020-12-14 DIAGNOSIS — Z1231 Encounter for screening mammogram for malignant neoplasm of breast: Secondary | ICD-10-CM

## 2021-01-18 ENCOUNTER — Ambulatory Visit: Payer: Commercial Managed Care - PPO | Admitting: Obstetrics and Gynecology

## 2021-01-18 ENCOUNTER — Encounter: Payer: Self-pay | Admitting: Obstetrics and Gynecology

## 2021-01-18 ENCOUNTER — Other Ambulatory Visit: Payer: Self-pay

## 2021-01-18 VITALS — BP 106/64 | HR 77 | Ht <= 58 in | Wt 124.0 lb

## 2021-01-18 DIAGNOSIS — R39198 Other difficulties with micturition: Secondary | ICD-10-CM

## 2021-01-18 LAB — URINALYSIS
Bilirubin Urine: NEGATIVE
Glucose, UA: NEGATIVE
Hgb urine dipstick: NEGATIVE
Ketones, ur: NEGATIVE
Leukocytes,Ua: NEGATIVE
Nitrite: NEGATIVE
Protein, ur: NEGATIVE
Specific Gravity, Urine: 1.015 (ref 1.001–1.035)
pH: 7 (ref 5.0–8.0)

## 2021-01-18 NOTE — Progress Notes (Signed)
GYNECOLOGY  VISIT   HPI: 55 y.o.   Married White or Caucasian Not Hispanic or Latino  female   [id number] with Patient's last menstrual period was 05/12/2018 (exact date).   here for slow urine stream. Patient denies urgency, frequency, and dysuria.  She has noticed a slow urinary stream for the last few weeks. No hesitancy to void. She feels she is emptying her bladder okay. Flow doesn't start and stop, steady but slow. Voiding normal amounts, slow every time. No vaginal bulge.  No constipation She has lost ~30 lbs.   GYNECOLOGIC HISTORY: Patient's last menstrual period was 05/12/2018 (exact date). Contraception:PMP Menopausal hormone therapy: none         OB History    Gravida  1   Para  1   Term  1   Preterm      AB      Living  1     SAB      IAB      Ectopic      Multiple      Live Births                 Patient Active Problem List   Diagnosis Date Noted  . Mild episode of recurrent major depressive disorder ([place]) 05/19/2020  . Chronic bilateral low back pain without sciatica 01/26/2020  . Class 1 obesity due to excess calories without serious comorbidity in adult 01/26/2020  . Elevated LDL cholesterol level 01/26/2020  . Herpes simplex vulvovaginitis 01/26/2020  . Moderate persistent asthma without complication 50/35/4656  . Conductive hearing loss of right ear with unrestricted hearing of left ear 10/16/2018  . Pulsatile tinnitus of right ear 10/16/2018  . Vitamin D deficiency 08/22/2017  . Cervical high risk HPV (human papillomavirus) test positive 10/16/2016  . History of osteopenia 10/16/2016  . H/O basal cell carcinoma excision 05/26/2015  . Anxiety and depression 03/23/2015  . Asthma, mild intermittent, well-controlled 03/23/2015    Past Medical History:  Diagnosis Date  . Abnormal Pap smear of cervix 2000   Hx of LEEP and paps normal since  . Allergy   . Anxiety   . Asthma   . Cancer ([place]) 2016, 2010   basal cell on head and collarbone   . Depression   . Hx of osteopenia   . STD (sexually transmitted disease)    HSV II    Past Surgical History:  Procedure Laterality Date  . CERVICAL BIOPSY  W/ LOOP ELECTRODE EXCISION  2000   in [place]  . SKIN CANCER EXCISION      Current Outpatient Medications  Medication Sig Dispense Refill  . escitalopram (LEXAPRO) 20 MG tablet Take 1 tablet (20 mg total) by mouth daily. 90 tablet 4  . Ferrous Sulfate (IRON PO) Take by mouth.    . Fluticasone-Salmeterol (ADVAIR DISKUS) 100-50 MCG/DOSE AEPB Inhale 1 puff into the lungs 2 (two) times daily. 180 each 4  . metaxalone (SKELAXIN) 800 MG tablet Take 0.5-1 tablets (400-800 mg total) by mouth 3 (three) times daily. Take only as needed. 90 tablet 1  . Multiple Vitamin (MULTIVITAMIN) capsule Take 1 capsule by mouth daily.    . Omega-3 Fatty Acids (FISH OIL) 1000 MG CAPS Take by mouth.    . pantoprazole (PROTONIX) 40 MG tablet Take 1 tablet (40 mg total) by mouth daily. 90 tablet 4  . valACYclovir (VALTREX) 500 MG tablet TAKE 1 TABLET BY MOUTH EVERY DAY, INCREASE TO 1 TABLET TWICE DAILY FOR 3 DAYS FOR  OUTBREAK 90 tablet 4   No current facility-administered medications for this visit.     ALLERGIES: Elemental sulfur and Metronidazole  Family History  Problem Relation Age of Onset  . Heart disease Maternal Grandfather   . Hypertension Maternal Grandfather   . Diabetes Mother        diet controlled  . Hyperlipidemia Mother   . Hypertension Mother   . Hyperlipidemia Father   . Alzheimer's disease Paternal Grandfather   . Cancer Other   . Colon cancer Neg Hx   . Esophageal cancer Neg Hx   . Rectal cancer Neg Hx   . Stomach cancer Neg Hx   . Colon polyps Neg Hx     Social History   Socioeconomic History  . Marital status: Married    Spouse name: Not on file  . Number of children: Not on file  . Years of education: Not on file  . Highest education level: Not on file  Occupational History  . Occupation: accounting    Comment: [employer]  Tobacco Use  . Smoking status: Never Smoker  . Smokeless tobacco: Never Used  Vaping Use  . Vaping Use: Never used  Substance and Sexual Activity  . Alcohol use: Not Currently    Comment: rarely  . Drug use: No  . Sexual activity: Yes    Comment: partner w/ vasectomy  Other Topics Concern  . Not on file  Social History Narrative   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 [employer]   Exercise - no   Diet - try to eat healthy - could make some changes   Social Determinants of Health   Financial Resource Strain: Not on file  Food Insecurity: Not on file  Transportation Needs: Not on file  Physical Activity: Not on file  Stress: Not on file  Social Connections: Not on file  Intimate Partner Violence: Not on file    Review of Systems  All other systems reviewed and are negative.   PHYSICAL EXAMINATION:    BP 106/64   Pulse 77   Ht 4\' 10"  (1.473 m)   Wt 124 lb (56.2 kg)   LMP 05/12/2018 (Exact Date)   SpO2 98%   BMI 25.92 kg/m     General appearance: alert, cooperative and appears stated age Abdomen: soft, non-tender; non distended, no masses,  no organomegaly  Pelvic: External genitalia:  no lesions              Urethra:  normal appearing urethra with no masses, tenderness or lesions              Bartholins and Skenes: normal                 Vagina: atrophic appearing vagina with normal color and discharge, no lesions, no significant prolapse              Cervix: no lesions              Bimanual Exam:  Uterus:  normal size, contour, position, consistency, mobility, non-tender              Adnexa: no mass, fullness, tenderness              Bladder: not tender  The patient was in and out catheterized using sterile technique. Post void residual 50 cc  Chaperone was present for exam.  1. Decreased urine stream Normal exam, normal PVR, no other c/o muscle weakness, no signs of genital prolapse -  Urinalysis -Patient reassured, if symptoms worsen she will reach  out.

## 2021-02-15 ENCOUNTER — Ambulatory Visit
Admission: RE | Admit: 2021-02-15 | Discharge: 2021-02-15 | Disposition: A | Discharge status: Home / Self Care | Payer: Commercial Managed Care - PPO | Source: Ambulatory Visit | Attending: Obstetrics and Gynecology | Admitting: Obstetrics and Gynecology

## 2021-02-15 ENCOUNTER — Other Ambulatory Visit: Payer: Self-pay

## 2021-02-15 DIAGNOSIS — Z1231 Encounter for screening mammogram for malignant neoplasm of breast: Secondary | ICD-10-CM

## 2021-10-10 NOTE — Progress Notes (Signed)
56 y.o. G50P1001 Married White or Caucasian Not Hispanic or Latino female here for annual exam.  No vaginal bleeding. Some vaginal discomfort or dryness with intercourse, mild. Uses a lubricant.  ?  ?She has a h/o recurrent vulvar irritation (top, left vulva), think herpes outbreaks cause irritation. She just recently started taking her valtrex daily. It does heal up, but when it gets sore it stays sore for a week. Helped with steroid ointment.  ?She has been getting HSV outbreaks every 3 months.  ? ?No bowel or bladder c/o. Occasional slow urinary stream, not always, feels she empties well.  ? ?Patient's last menstrual period was 05/12/2018 (exact date).          ?Sexually active: Yes.    ?The current method of family planning is post menopausal status.    ?Exercising: Yes.     Walking the dogs  ?Smoker:  no ? ?Health Maintenance: ?Pap: 10/12/20 WNL Hr HPV Neg, /05/2018 WNL NEG HPV, 10/2016 Neg ?History of abnormal Pap:  yes LEEP in 2000 ?MMG:   02/17/21 density C Bi-rads 1 neg  ?BMD:   11/13/16 normal  ?Colonoscopy: 02/01/20 f/u 10 years  ?TDaP:  05/26/15  ?Gardasil: n/a ? ? reports that she has never smoked. She has never used smokeless tobacco. She reports that she does not currently use alcohol. She reports that she does not use drugs. She is an Web designer. Daughter is 43, lives 2 miles away.  ? ?Past Medical History:  ?Diagnosis Date  ? Abnormal Pap smear of cervix 2000  ? Hx of LEEP and paps normal since  ? Allergy   ? Anxiety   ? Asthma   ? Cancer ([place]) 2016, 2010  ? basal cell on head and collarbone  ? Depression   ? Hx of osteopenia   ? STD (sexually transmitted disease)   ? HSV II  ? ? ?Past Surgical History:  ?Procedure Laterality Date  ? CERVICAL BIOPSY  W/ LOOP ELECTRODE EXCISION  2000  ? in [place]  ? SKIN CANCER EXCISION    ? ? ?Current Outpatient Medications  ?Medication Sig Dispense Refill  ? escitalopram (LEXAPRO) 20 MG tablet Take 1 tablet (20 mg total) by mouth daily. 90 tablet 4  ? Ferrous  Sulfate (IRON PO) Take by mouth.    ? Fluticasone-Salmeterol (ADVAIR DISKUS) 100-50 MCG/DOSE AEPB Inhale 1 puff into the lungs 2 (two) times daily. 180 each 4  ? metaxalone (SKELAXIN) 800 MG tablet Take 0.5-1 tablets (400-800 mg total) by mouth 3 (three) times daily. Take only as needed. 90 tablet 1  ? Multiple Vitamin (MULTIVITAMIN) capsule Take 1 capsule by mouth daily.    ? Omega-3 Fatty Acids (FISH OIL) 1000 MG CAPS Take by mouth.    ? pantoprazole (PROTONIX) 40 MG tablet Take 1 tablet (40 mg total) by mouth daily. 90 tablet 4  ? valACYclovir (VALTREX) 500 MG tablet TAKE 1 TABLET BY MOUTH EVERY DAY, INCREASE TO 1 TABLET TWICE DAILY FOR 3 DAYS FOR OUTBREAK 90 tablet 4  ? VITAMIN D PO Take by mouth.    ? ?No current facility-administered medications for this visit.  ? ? ?Family History  ?Problem Relation Age of Onset  ? Heart disease Maternal Grandfather   ? Hypertension Maternal Grandfather   ? Diabetes Mother   ?     diet controlled  ? Hyperlipidemia Mother   ? Hypertension Mother   ? Hyperlipidemia Father   ? Alzheimer's disease Paternal Grandfather   ? Cancer Other   ?  Colon cancer Neg Hx   ? Esophageal cancer Neg Hx   ? Rectal cancer Neg Hx   ? Stomach cancer Neg Hx   ? Colon polyps Neg Hx   ? ? ?Review of Systems  ?All other systems reviewed and are negative. ? ?Exam:   ?BP 102/72   Pulse 79   Ht 4\' 11"  (1.499 m)   Wt 139 lb (63 kg)   LMP 05/12/2018 (Exact Date)   SpO2 96%   BMI 28.07 kg/m?   Weight change: @WEIGHTCHANGE @ Height:   Height: 4\' 11"  (149.9 cm)  ?Ht Readings from Last 3 Encounters:  ?10/18/21 4\' 11"  (1.499 m)  ?01/18/21 4\' 10"  (1.473 m)  ?10/12/20 4' 10.5" (1.486 m)  ? ? ?General appearance: alert, cooperative and appears stated age ?Head: Normocephalic, without obvious abnormality, atraumatic ?Neck: no adenopathy, supple, symmetrical, trachea midline and thyroid normal to inspection and palpation ?Lungs: clear to auscultation bilaterally ?Cardiovascular: regular rate and rhythm ?Breasts:  normal appearance, no masses or tenderness ?Abdomen: soft, non-tender; non distended,  no masses,  no organomegaly ?Extremities: extremities normal, atraumatic, no cyanosis or edema ?Skin: Skin color, texture, turgor normal. No rashes or lesions ?Lymph nodes: Cervical, supraclavicular, and axillary nodes normal. ?No abnormal inguinal nodes palpated ?Neurologic: Grossly normal ? ? ?Pelvic: External genitalia:  focal whitening above the clitoris, no agglutination or loss of architecture. ?             Urethra:  normal appearing urethra with no masses, tenderness or lesions ?             Bartholins and Skenes: normal    ?             Vagina: mildly atrophic appearing vagina with normal color and discharge, no lesions ?             Cervix: no lesions, stenotic ?              ?Bimanual Exam:  Uterus:  normal size, contour, position, consistency, mobility, non-tender ?             Adnexa: no mass, fullness, tenderness ?              Rectovaginal: Confirms ?              Anus:  normal sphincter tone, no lesions ? ?Gae Dry chaperoned for the exam. ? ?1. Well woman exam ?Discussed breast self exam ?Discussed calcium and vit D intake ?Mammogram in 7/23 ?Colonoscopy UTD ?Labs with primary ? ?2. Genital herpes simplex, unspecified site ?- valACYclovir (VALTREX) 500 MG tablet; TAKE 1 TABLET BY MOUTH EVERY DAY, INCREASE TO 1 TABLET TWICE DAILY FOR 3 DAYS FOR OUTBREAK  Dispense: 90 tablet; Refill: 4 ? ?3. Chronic vulvitis ?Focal ?-Discussed vulvar skin care. ?- betamethasone valerate ointment (VALISONE) 0.1 %; Apply 1 application. topically 2 (two) times daily. Not for daily long term use.  Dispense: 30 g; Refill: 1 ? ? ?

## 2021-10-18 ENCOUNTER — Ambulatory Visit (INDEPENDENT_AMBULATORY_CARE_PROVIDER_SITE_OTHER): Payer: Commercial Managed Care - PPO | Admitting: Obstetrics and Gynecology

## 2021-10-18 ENCOUNTER — Encounter: Payer: Self-pay | Admitting: Obstetrics and Gynecology

## 2021-10-18 ENCOUNTER — Other Ambulatory Visit: Payer: Self-pay

## 2021-10-18 VITALS — BP 102/72 | HR 79 | Ht 59.0 in | Wt 139.0 lb

## 2021-10-18 DIAGNOSIS — Z01419 Encounter for gynecological examination (general) (routine) without abnormal findings: Secondary | ICD-10-CM

## 2021-10-18 DIAGNOSIS — N763 Subacute and chronic vulvitis: Secondary | ICD-10-CM

## 2021-10-18 DIAGNOSIS — A6000 Herpesviral infection of urogenital system, unspecified: Secondary | ICD-10-CM | POA: Diagnosis not present

## 2021-10-18 MED ORDER — VALACYCLOVIR HCL 500 MG PO TABS: ORAL_TABLET | ORAL | 4 refills | Status: DC

## 2021-10-18 MED ORDER — BETAMETHASONE VALERATE 0.1 % EX OINT: 1.0000 "application " | TOPICAL_OINTMENT | Freq: Two times a day (BID) | CUTANEOUS | 1 refills | Status: DC

## 2021-10-18 NOTE — Patient Instructions (Signed)
Try Uberlube for vaginal lubrication  EXERCISE   We recommended that you start or continue a regular exercise program for good health. Physical activity is anything that gets your body moving, some is better than none. The CDC recommends 150 minutes per week of Moderate-Intensity Aerobic Activity and 2 or more days of Muscle Strengthening Activity.  Benefits of exercise are limitless: helps weight loss/weight maintenance, improves mood and energy, helps with depression and anxiety, improves sleep, tones and strengthens muscles, improves balance, improves bone density, protects from chronic conditions such as heart disease, high blood pressure and diabetes and so much more. To learn more visit: https://www.cdc.gov/physicalactivity/index.html  DIET: Good nutrition starts with a healthy diet of fruits, vegetables, whole grains, and lean protein sources. Drink plenty of water for hydration. Minimize empty calories, sodium, sweets. For more information about dietary recommendations visit: https://health.gov/our-work/nutrition-physical-activity/dietary-guidelines and https://www.myplate.gov/  ALCOHOL:  Women should limit their alcohol intake to no more than 7 drinks/beers/glasses of wine (combined, not each!) per week. Moderation of alcohol intake to this level decreases your risk of breast cancer and liver damage.  If you are concerned that you may have a problem, or your friends have told you they are concerned about your drinking, there are many resources to help. A well-known program that is free, effective, and available to all people all over the nation is Alcoholics Anonymous.  Check out this site to learn more: https://www.aa.org/   CALCIUM AND VITAMIN D:  Adequate intake of calcium and Vitamin D are recommended for bone health.  You should be getting between 1000-1200 mg of calcium and 800 units of Vitamin D daily between diet and supplements  PAP SMEARS:  Pap smears, to check for cervical cancer or  precancers,  have traditionally been done yearly, scientific advances have shown that most women can have pap smears less often.  However, every woman still should have a physical exam from her gynecologist every year. It will include a breast check, inspection of the vulva and vagina to check for abnormal growths or skin changes, a visual exam of the cervix, and then an exam to evaluate the size and shape of the uterus and ovaries. We will also provide age appropriate advice regarding health maintenance, like when you should have certain vaccines, screening for sexually transmitted diseases, bone density testing, colonoscopy, mammograms, etc.   MAMMOGRAMS:  All women over 40 years old should have a routine mammogram.   COLON CANCER SCREENING: Now recommend starting at age 45. At this time colonoscopy is not covered for routine screening until 50. There are take home tests that can be done between 45-49.   COLONOSCOPY:  Colonoscopy to screen for colon cancer is recommended for all women at age 50.  We know, you hate the idea of the prep.  We agree, BUT, having colon cancer and not knowing it is worse!!  Colon cancer so often starts as a polyp that can be seen and removed at colonscopy, which can quite literally save your life!  And if your first colonoscopy is normal and you have no family history of colon cancer, most women don't have to have it again for 10 years.  Once every ten years, you can do something that may end up saving your life, right?  We will be happy to help you get it scheduled when you are ready.  Be sure to check your insurance coverage so you understand how much it will cost.  It may be covered as a preventative service at   no cost, but you should check your particular policy.      Breast Self-Awareness Breast self-awareness means being familiar with how your breasts look and feel. It involves checking your breasts regularly and reporting any changes to your health care  provider. Practicing breast self-awareness is important. A change in your breasts can be a sign of a serious medical problem. Being familiar with how your breasts look and feel allows you to find any problems early, when treatment is more likely to be successful. All women should practice breast self-awareness, including women who have had breast implants. How to do a breast self-exam One way to learn what is normal for your breasts and whether your breasts are changing is to do a breast self-exam. To do a breast self-exam: Look for Changes  Remove all the clothing above your waist. Stand in front of a mirror in a room with good lighting. Put your hands on your hips. Push your hands firmly downward. Compare your breasts in the mirror. Look for differences between them (asymmetry), such as: Differences in shape. Differences in size. Puckers, dips, and bumps in one breast and not the other. Look at each breast for changes in your skin, such as: Redness. Scaly areas. Look for changes in your nipples, such as: Discharge. Bleeding. Dimpling. Redness. A change in position. Feel for Changes Carefully feel your breasts for lumps and changes. It is best to do this while lying on your back on the floor and again while sitting or standing in the shower or tub with soapy water on your skin. Feel each breast in the following way: Place the arm on the side of the breast you are examining above your head. Feel your breast with the other hand. Start in the nipple area and make  inch (2 cm) overlapping circles to feel your breast. Use the pads of your three middle fingers to do this. Apply light pressure, then medium pressure, then firm pressure. The light pressure will allow you to feel the tissue closest to the skin. The medium pressure will allow you to feel the tissue that is a little deeper. The firm pressure will allow you to feel the tissue close to the ribs. Continue the overlapping circles,  moving downward over the breast until you feel your ribs below your breast. Move one finger-width toward the center of the body. Continue to use the  inch (2 cm) overlapping circles to feel your breast as you move slowly up toward your collarbone. Continue the up and down exam using all three pressures until you reach your armpit.  Write Down What You Find  Write down what is normal for each breast and any changes that you find. Keep a written record with breast changes or normal findings for each breast. By writing this information down, you do not need to depend only on memory for size, tenderness, or location. Write down where you are in your menstrual cycle, if you are still menstruating. If you are having trouble noticing differences in your breasts, do not get discouraged. With time you will become more familiar with the variations in your breasts and more comfortable with the exam. How often should I examine my breasts? Examine your breasts every month. If you are breastfeeding, the best time to examine your breasts is after a feeding or after using a breast pump. If you menstruate, the best time to examine your breasts is 5-7 days after your period is over. During your period, your breasts are   lumpier, and it may be more difficult to notice changes. When should I see my health care provider? See your health care provider if you notice: A change in shape or size of your breasts or nipples. A change in the skin of your breast or nipples, such as a reddened or scaly area. Unusual discharge from your nipples. A lump or thick area that was not there before. Pain in your breasts. Anything that concerns you.  

## 2022-03-03 DIAGNOSIS — M85851 Other specified disorders of bone density and structure, right thigh: Secondary | ICD-10-CM | POA: Insufficient documentation

## 2022-10-16 NOTE — Progress Notes (Signed)
57 y.o. G36P1001 Married White or Caucasian Not Hispanic or Latino female here for annual exam.  No vaginal bleeding. Occasional entry dyspareunia, not consistently using a lubricant.   No bowel or bladder c/o.    H/O HSV, on suppression (takes every other day).   Patient's last menstrual period was 05/12/2018 (exact date).          Sexually active: Yes.    The current method of family planning is post menopausal status.    Exercising: No.  The patient does not participate in regular exercise at present. Smoker:  no  Health Maintenance: Pap:  10/12/20 WNL Hr HPV Neg; /05/2018 WNL NEG HPV, 10/2016 Neg  History of abnormal Pap:  yes LEEP in 2000  MMG: 02/27/22 Bi-rads cat 2 ([facility]) BMD:  02/27/22 osteopenia, T score -1.9, FRAX 6.9/0.8% ([facility]) Colonoscopy: 02/01/20 f/u 10 years  TDaP:  05/26/15  Gardasil: n/a   reports that she has never smoked. She has never used smokeless tobacco. She reports that she does not currently use alcohol. She reports that she does not use drugs. 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 They live 2 miles away.   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 topically 2 (two) times daily. Not for daily long term use. 30 g 1   escitalopram (LEXAPRO) 20 MG tablet Take 1 tablet (20 mg total) by mouth daily. 90 tablet 4   Fluticasone-Salmeterol (ADVAIR DISKUS) 100-50 MCG/DOSE AEPB Inhale 1 puff into the  lungs 2 (two) times daily. 180 each 4   metaxalone (SKELAXIN) 800 MG tablet Take 0.5-1 tablets (400-800 mg total) by mouth 3 (three) times daily. Take only as needed. 90 tablet 1   Multiple Vitamin (MULTIVITAMIN) capsule Take 1 capsule by mouth daily.     rosuvastatin (CRESTOR) 10 MG tablet Take 10 mg by mouth at bedtime.     valACYclovir (VALTREX) 500 MG tablet TAKE 1 TABLET BY MOUTH EVERY DAY, INCREASE TO 1 TABLET TWICE DAILY FOR 3 DAYS FOR OUTBREAK 90 tablet 4   VITAMIN D PO Take by mouth.     No current facility-administered medications for this visit.    Family History  Problem Relation Age of Onset   Heart disease Maternal Grandfather    Hypertension Maternal Grandfather    Diabetes Mother        diet controlled   Hyperlipidemia Mother    Hypertension Mother    Hyperlipidemia Father    Alzheimer's disease Paternal Grandfather    Cancer Other    Colon cancer Neg Hx    Esophageal cancer Neg Hx    Rectal cancer Neg Hx    Stomach cancer Neg Hx    Colon polyps Neg Hx     Review of Systems  All other systems reviewed  and are negative.   Exam:   BP 108/62   Pulse 62   Ht 4' 8.69" (1.44 m)   Wt 152 lb (68.9 kg)   LMP 05/12/2018 (Exact Date)   SpO2 100%   BMI 33.25 kg/m   Weight change: '@WEIGHTCHANGE'$ @ Height:   Height: 4' 8.69" (144 cm)  Ht Readings from Last 3 Encounters:  10/24/22 4' 8.69" (1.44 m)  10/18/21 '4\' 11"'$  (1.499 m)  01/18/21 '4\' 10"'$  (1.473 m)    General appearance: alert, cooperative and appears stated age Head: Normocephalic, without obvious abnormality, atraumatic Neck: no adenopathy, supple, symmetrical, trachea midline and thyroid normal to inspection and palpation Lungs: clear to auscultation bilaterally Cardiovascular: regular rate and rhythm Breasts: normal appearance, no masses or tenderness Abdomen: soft, non-tender; non distended,  no masses,  no organomegaly Extremities: extremities normal, atraumatic, no cyanosis or edema Skin: Skin color,  texture, turgor normal. No rashes or lesions Lymph nodes: Cervical, supraclavicular, and axillary nodes normal. No abnormal inguinal nodes palpated Neurologic: Grossly normal   Pelvic: External genitalia:  whitening on the right vulva and above the clitoris with agglutination, fissure noted above the clitoris.               Urethra:  normal appearing urethra with no masses, tenderness or lesions              Bartholins and Skenes: normal                 Vagina: atrophic appearing vagina with normal color and discharge, no lesions              Cervix: no lesions               Bimanual Exam:  Uterus:   no masses or tenderness              Adnexa: no mass, fullness, tenderness               Rectovaginal: Confirms               Anus:  normal sphincter tone, no lesions  Gae Dry, CMA chaperoned for the exam.  1. Well woman exam Discussed breast self exam Discussed calcium and vit D intake Pap due next year Mammogram, colonoscopy and labs are UTD  2. Genital herpes simplex, unspecified site - valACYclovir (VALTREX) 500 MG tablet; TAKE 1 TABLET BY MOUTH EVERY DAY, INCREASE TO 1 TABLET TWICE DAILY FOR 3 DAYS FOR OUTBREAK  Dispense: 90 tablet; Refill: 4  3. Vaginal dryness Given samples of uberlube  4. 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

## 2022-10-24 ENCOUNTER — Ambulatory Visit (INDEPENDENT_AMBULATORY_CARE_PROVIDER_SITE_OTHER): Payer: Commercial Managed Care - PPO | Admitting: Obstetrics and Gynecology

## 2022-10-24 ENCOUNTER — Encounter: Payer: Self-pay | Admitting: Obstetrics and Gynecology

## 2022-10-24 VITALS — BP 108/62 | HR 62 | Ht <= 58 in | Wt 152.0 lb

## 2022-10-24 DIAGNOSIS — N898 Other specified noninflammatory disorders of vagina: Secondary | ICD-10-CM

## 2022-10-24 DIAGNOSIS — N9089 Other specified noninflammatory disorders of vulva and perineum: Secondary | ICD-10-CM | POA: Diagnosis not present

## 2022-10-24 DIAGNOSIS — A6000 Herpesviral infection of urogenital system, unspecified: Secondary | ICD-10-CM | POA: Diagnosis not present

## 2022-10-24 DIAGNOSIS — Z01419 Encounter for gynecological examination (general) (routine) without abnormal findings: Secondary | ICD-10-CM

## 2022-10-24 MED ORDER — VALACYCLOVIR HCL 500 MG PO TABS: ORAL_TABLET | ORAL | 4 refills | Status: AC

## 2022-10-24 NOTE — Patient Instructions (Signed)

## 2022-11-15 ENCOUNTER — Ambulatory Visit: Payer: Commercial Managed Care - PPO | Admitting: Obstetrics and Gynecology

## 2022-11-15 ENCOUNTER — Other Ambulatory Visit (HOSPITAL_COMMUNITY)
Admission: RE | Admit: 2022-11-15 | Discharge: 2022-11-15 | Disposition: A | Discharge status: Home / Self Care | Payer: Commercial Managed Care - PPO | Source: Ambulatory Visit | Attending: Obstetrics and Gynecology | Admitting: Obstetrics and Gynecology

## 2022-11-15 ENCOUNTER — Encounter: Payer: Self-pay | Admitting: Obstetrics and Gynecology

## 2022-11-15 VITALS — BP 120/72 | HR 64 | Wt 152.0 lb

## 2022-11-15 DIAGNOSIS — L292 Pruritus vulvae: Secondary | ICD-10-CM | POA: Diagnosis not present

## 2022-11-15 DIAGNOSIS — N9089 Other specified noninflammatory disorders of vulva and perineum: Secondary | ICD-10-CM | POA: Diagnosis present

## 2022-11-15 MED ORDER — CLOBETASOL PROPIONATE 0.05 % EX OINT
1.0000 | TOPICAL_OINTMENT | Freq: Two times a day (BID) | CUTANEOUS | 0 refills | Status: AC
Start: 2022-11-15 — End: ?

## 2022-11-15 NOTE — Progress Notes (Signed)
GYNECOLOGY  VISIT   HPI: 57 y.o.   Married White or Caucasian Not Hispanic or Latino  female   G1P1001 with Patient's last menstrual period was 05/12/2018 (exact date).   here for  vulvar Biopsy   GYNECOLOGIC HISTORY: Patient's last menstrual period was 05/12/2018 (exact date). Contraception:PMP Menopausal hormone therapy: none         OB History     Gravida  1   Para  1   Term  1   Preterm      AB      Living  1      SAB      IAB      Ectopic      Multiple      Live Births                 Patient Active Problem List   Diagnosis Date Noted   Osteopenia of neck of right femur 03/03/2022   Mild episode of recurrent major depressive disorder 05/19/2020   Chronic bilateral low back pain without sciatica 01/26/2020   Class 1 obesity due to excess calories without serious comorbidity in adult 01/26/2020   Elevated LDL cholesterol level 01/26/2020   Herpes simplex vulvovaginitis 01/26/2020   Moderate persistent asthma without complication [id number]   Overweight 01/26/2020   Conductive hearing loss of right ear with unrestricted hearing of left ear 10/16/2018   Pulsatile tinnitus of right ear 10/16/2018   Vitamin D deficiency 08/22/2017   Cervical high risk HPV (human papillomavirus) test positive 10/16/2016   History of osteopenia 10/16/2016   H/O basal cell carcinoma excision 05/26/2015   Anxiety and depression 03/23/2015   Asthma, mild intermittent, well-controlled 03/23/2015    Past Medical History:  Diagnosis Date   Abnormal Pap smear of cervix 2000   Hx of LEEP and paps normal since   Allergy    Anxiety    Asthma    Cancer ([place]) 2016, 2010   basal cell on head and collarbone   Depression    Hx of osteopenia    STD (sexually transmitted disease)    HSV II    Past Surgical History:  Procedure Laterality Date   CERVICAL BIOPSY  W/ LOOP ELECTRODE EXCISION  2000   in [place]   SKIN CANCER EXCISION      Current Outpatient Medications   Medication Sig Dispense Refill   betamethasone valerate ointment (VALISONE) 0.1 % Apply 1 application. topically 2 (two) times daily. Not for daily long term use. 30 g 1   escitalopram (LEXAPRO) 20 MG tablet Take 1 tablet (20 mg total) by mouth daily. 90 tablet 4   Fluticasone-Salmeterol (ADVAIR DISKUS) 100-50 MCG/DOSE AEPB Inhale 1 puff into the lungs 2 (two) times daily. 180 each 4   metaxalone (SKELAXIN) 800 MG tablet Take 0.5-1 tablets (400-800 mg total) by mouth 3 (three) times daily. Take only as needed. 90 tablet 1   Multiple Vitamin (MULTIVITAMIN) capsule Take 1 capsule by mouth daily.     rosuvastatin (CRESTOR) 10 MG tablet Take 10 mg by mouth at bedtime.     valACYclovir (VALTREX) 500 MG tablet TAKE 1 TABLET BY MOUTH EVERY DAY, INCREASE TO 1 TABLET TWICE DAILY FOR 3 DAYS FOR OUTBREAK 90 tablet 4   VITAMIN D PO Take by mouth.     No current facility-administered medications for this visit.     ALLERGIES: Elemental sulfur and Metronidazole  Family History  Problem Relation Age of Onset   Heart disease Maternal  Grandfather    Hypertension Maternal Grandfather    Diabetes Mother        diet controlled   Hyperlipidemia Mother    Hypertension Mother    Hyperlipidemia Father    Alzheimer's disease Paternal Grandfather    Cancer Other    Colon cancer Neg Hx    Esophageal cancer Neg Hx    Rectal cancer Neg Hx    Stomach cancer Neg Hx    Colon polyps Neg Hx     Social History   Socioeconomic History   Marital status: Married    Spouse name: Not on file   Number of children: Not on file   Years of education: Not on file   Highest education level: Not on file  Occupational History   Occupation: accounting    Comment: [employer]  Tobacco Use   Smoking status: Never   Smokeless tobacco: Never  Vaping Use   Vaping Use: Never used  Substance and Sexual Activity   Alcohol use: Not Currently    Comment: rarely   Drug use: No   Sexual activity: Yes    Comment: partner  w/ vasectomy  Other Topics Concern   Not on file  Social History Narrative   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 [employer]   Exercise - no   Diet - try to eat healthy - could make some changes   Social Determinants of Health   Financial Resource Strain: Not on file  Food Insecurity: Not on file  Transportation Needs: Not on file  Physical Activity: Not on file  Stress: Not on file  Social Connections: Not on file  Intimate Partner Violence: Not on file    Review of Systems  All other systems reviewed and are negative.   PHYSICAL EXAMINATION:    BP 120/72   Pulse 64   Wt 152 lb (68.9 kg)   LMP 05/12/2018 (Exact Date)   SpO2 98%   BMI 33.25 kg/m     General appearance: alert, cooperative and appears stated age  Pelvic: External genitalia:  whitening on the right vulva and above the clitoris with agglutination, fissure noted above the clitoris.               Urethra:  normal appearing urethra with no masses, tenderness or lesions              Bartholins and Skenes: normal     The risks of the procedure were reviewed with the patient and a consent was signed. The area above the clitoris was cleansed with Hibiclens and injected with 1% lidocaine. A 3 mm punch biopsy was used to remove a circular piece of tissue. On transfer of the specimen the specimen was lost. The procedure was repeated next to the other biopsy site. The defect was closed with 4-0 vicryl. The patient tolerated the procedure well.  Done x 2               Chaperone was present for exam.  1. Vulvar lesion Skin changes c/w lichen sclerosis. Discussed lichen sclerosis with the patient. Reviewed need for yearly vulvar skin checks. Discussed use of steroid ointment as needed and use 1-2 x a week baseline if the biopsy does return with lichen sclerosis.  - Biopsy vulva - Surgical pathology( [facility]/ [facility])  2. Vulvar pruritus - clobetasol ointment (TEMOVATE) 0.05 %; Apply 1 Application topically 2 (two) times  daily. Apply a pea sized amount  twice daily for up to  2 weeks as needed  Dispense: 60 g; Refill: 0

## 2022-11-15 NOTE — Patient Instructions (Signed)

## 2022-11-16 LAB — SURGICAL PATHOLOGY

## 2023-10-25 ENCOUNTER — Ambulatory Visit: Payer: Commercial Managed Care - PPO | Admitting: Obstetrics and Gynecology

## 2023-10-25 ENCOUNTER — Encounter: Payer: Self-pay | Admitting: Obstetrics and Gynecology

## 2023-10-25 ENCOUNTER — Other Ambulatory Visit (HOSPITAL_COMMUNITY)
Admission: RE | Admit: 2023-10-25 | Discharge: 2023-10-25 | Disposition: A | Discharge status: Home / Self Care | Source: Ambulatory Visit | Attending: Obstetrics and Gynecology | Admitting: Obstetrics and Gynecology

## 2023-10-25 VITALS — BP 122/84 | HR 72 | Ht <= 58 in | Wt 156.0 lb

## 2023-10-25 DIAGNOSIS — Z01419 Encounter for gynecological examination (general) (routine) without abnormal findings: Secondary | ICD-10-CM | POA: Insufficient documentation

## 2023-10-25 DIAGNOSIS — E2839 Other primary ovarian failure: Secondary | ICD-10-CM

## 2023-10-25 DIAGNOSIS — Z1231 Encounter for screening mammogram for malignant neoplasm of breast: Secondary | ICD-10-CM

## 2023-10-25 MED ORDER — INTRAROSA 6.5 MG VA INST
1.0000 | VAGINAL_INSERT | Freq: Every evening | VAGINAL | 12 refills | Status: AC | PRN
Start: 2023-10-25 — End: ?

## 2023-10-25 NOTE — Patient Instructions (Signed)
 I sent over intrarosa to [facility].  This is a vaginal suppository with DHEA that converts to estrogen and testosterone.  It will help with vaginal dryness, libido and the bladder.  It can be pricey, so I send it to them, but I think it will help.  I noticed it was a concern last year and didn't get to touch on it much today, but it is a great drug. Dr. Karma Greaser

## 2023-10-25 NOTE — Progress Notes (Addendum)
 58 y.o. y.o. female here for annual exam. Patient's last menstrual period was 05/12/2018 (exact date).    [id number] Married White or Caucasian Not Hispanic or Latino female here for annual exam.  No vaginal bleeding. Occasional entry dyspareunia, not consistently using a lubricant.   No bowel or bladder c/o.    H/O HSV, on suppression (takes every other day).   Patient's last menstrual period was 05/12/2018 (exact date).          Sexually active: Yes.    The current method of family planning is post menopausal status.    Exercising: No.  The patient does not participate in regular exercise at present. Smoker:  no  Health Maintenance: Pap:  10/12/20 WNL Hr HPV Neg; /05/2018 WNL NEG HPV, 10/2016 Neg  History of abnormal Pap:  yes LEEP in 2000  MMG: 02/27/22 Bi-rads cat 2 ([facility]) BMD:  Has asthma with several years on prednisone now on advair. No fractures. DXA 2018 normal. Was on fosamax and boniva for several years off bone support medications for 20 years. 02/27/22 osteopenia, T score -1.9, FRAX 6.9/0.8% ([facility]) repeat FRAX score to verify 14% and 1.9, since on steroids but normal.  To repeat dxa  in July and may need prolia  or evenity based on results. Has also done course with actonel with no improvement and worried about prolonged bisphosphonate use and risk for atypical femur fracture.  She would like to go on prolia . Colonoscopy: 02/01/20 f/u 10 years  TDaP:  05/26/15  Gardasil: n/a  There is no height or weight on file to calculate BMI.     10/19/2019    4:04 PM 06/11/2019    4:01 PM 02/10/2019    2:35 PM  Depression screen PHQ 2/9  Decreased Interest 0 0 0  Down, Depressed, Hopeless 0 0 0  PHQ - 2 Score 0 0 0    Last menstrual period 05/12/2018.     Component Value Date/Time   DIAGPAP  10/12/2020 0844    - Negative for intraepithelial lesion or malignancy (NILM)   DIAGPAP  08/22/2017 0000    NEGATIVE FOR INTRAEPITHELIAL LESIONS OR MALIGNANCY.   HPVHIGH Negative  10/12/2020 0844   ADEQPAP  10/12/2020 0844    Satisfactory for evaluation; transformation zone component PRESENT.   ADEQPAP  08/22/2017 0000    Satisfactory for evaluation  endocervical/transformation zone component PRESENT.    GYN HISTORY:    Component Value Date/Time   DIAGPAP  10/12/2020 0844    - Negative for intraepithelial lesion or malignancy (NILM)   DIAGPAP  08/22/2017 0000    NEGATIVE FOR INTRAEPITHELIAL LESIONS OR MALIGNANCY.   HPVHIGH Negative 10/12/2020 0844   ADEQPAP  10/12/2020 0844    Satisfactory for evaluation; transformation zone component PRESENT.   ADEQPAP  08/22/2017 0000    Satisfactory for evaluation  endocervical/transformation zone component PRESENT.    OB History  Gravida Para Term Preterm AB Living  1 1 1   1   SAB IAB Ectopic Multiple Live Births          # Outcome Date GA Lbr Len/2nd Weight Sex Type Anes PTL Lv  1 Term             Past Medical History:  Diagnosis Date   Abnormal Pap smear of cervix 2000   Hx of LEEP and paps normal since   Allergy    Anxiety    Asthma    Cancer (HCC) 2016, 2010   basal cell on head and  collarbone   Depression    Hx of osteopenia    STD (sexually transmitted disease)    HSV II    Past Surgical History:  Procedure Laterality Date   CERVICAL BIOPSY  W/ LOOP ELECTRODE EXCISION  2000   in [place]   SKIN CANCER EXCISION      Current Outpatient Medications on File Prior to Visit  Medication Sig Dispense Refill   clobetasol  ointment (TEMOVATE ) 0.05 % Apply 1 Application topically 2 (two) times daily. Apply a pea sized amount  twice daily for up to 2 weeks as needed 60 g 0   escitalopram  (LEXAPRO ) 20 MG tablet Take 1 tablet (20 mg total) by mouth daily. 90 tablet 4   Fluticasone -Salmeterol (ADVAIR DISKUS) 100-50 MCG/DOSE AEPB Inhale 1 puff into the lungs 2 (two) times daily. 180 each 4   metaxalone  (SKELAXIN ) 800 MG tablet Take 0.5-1 tablets (400-800 mg total) by mouth 3 (three) times daily. Take only as  needed. 90 tablet 1   Multiple Vitamin (MULTIVITAMIN) capsule Take 1 capsule by mouth daily.     rosuvastatin (CRESTOR) 10 MG tablet Take 10 mg by mouth at bedtime.     valACYclovir  (VALTREX ) 500 MG tablet TAKE 1 TABLET BY MOUTH EVERY DAY, INCREASE TO 1 TABLET TWICE DAILY FOR 3 DAYS FOR OUTBREAK 90 tablet 4   VITAMIN D  PO Take by mouth.     No current facility-administered medications on file prior to visit.    Social History   Socioeconomic History   Marital status: Married    Spouse name: Not on file   Number of children: Not on file   Years of education: Not on file   Highest education level: Not on file  Occupational History   Occupation: accounting    Comment: [employer]  Tobacco Use   Smoking status: Never   Smokeless tobacco: Never  Vaping Use   Vaping status: Never Used  Substance and Sexual Activity   Alcohol use: Not Currently    Comment: rarely   Drug use: No   Sexual activity: Yes    Comment: partner w/ vasectomy  Other Topics Concern   Not on file  Social History Narrative   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 [employer]   Exercise - no   Diet - try to eat healthy - could make some changes   Social Drivers of Health   Financial Resource Strain: Low Risk  (11/16/2022)   Received from [facility], [facility]   Overall Financial Resource Strain (CARDIA)    Difficulty of Paying Living Expenses: Not hard at all  Food Insecurity: No Food Insecurity (11/16/2022)   Received from [facility], [facility]   Hunger Vital Sign    Worried About Running Out of Food in the Last Year: Never true    Ran Out of Food in the Last Year: Never true  Transportation Needs: No Transportation Needs (11/16/2022)   Received from [employer], [facility]   PRAPARE - Transportation    Lack of Transportation (Medical): No    Lack of Transportation (Non-Medical): No  Physical Activity: Insufficiently Active (11/16/2022)   Received from [facility], [facility]   Exercise  Vital Sign    Days of Exercise per Week: 2 days    Minutes of Exercise per Session: 20 min  Stress: No Stress Concern Present (11/16/2022)   Received from [facility], [facility] of Occupational Health - Occupational Stress Questionnaire  Feeling of Stress : Only a little  Social Connections: Somewhat Isolated (11/16/2022)   Received from [facility], [facility]   Social Network    How would you rate your social network (family, work, friends)?: Restricted participation with some degree of social isolation  Intimate Partner Violence: Not At Risk (11/16/2022)   Received from [facility], [facility]   HITS    Over the last 12 months how often did your partner physically hurt you?: Never    Over the last 12 months how often did your partner insult you or talk down to you?: Never    Over the last 12 months how often did your partner threaten you with physical harm?: Never    Over the last 12 months how often did your partner scream or curse at you?: Never    Family History  Problem Relation Age of Onset   Heart disease Maternal Grandfather    Hypertension Maternal Grandfather    Diabetes Mother        diet controlled   Hyperlipidemia Mother    Hypertension Mother    Hyperlipidemia Father    Alzheimer's disease Paternal Grandfather    Cancer Other    Colon cancer Neg Hx    Esophageal cancer Neg Hx    Rectal cancer Neg Hx    Stomach cancer Neg Hx    Colon polyps Neg Hx      Allergies  Allergen Reactions   Elemental Sulfur Hives   Metronidazole Itching      Patient's last menstrual period was Patient's last menstrual period was 05/12/2018 (exact date)..            Review of Systems Alls systems reviewed and are negative.     Physical Exam Constitutional:      Appearance: Normal appearance.  Genitourinary:     Vulva and urethral meatus normal.     No lesions in the vagina.     Right Labia: No rash, lesions or skin changes.    Left  Labia: No lesions, skin changes or rash.    No vaginal discharge or tenderness.     No vaginal prolapse present.    No vaginal atrophy present.     Right Adnexa: not tender, not palpable and no mass present.    Left Adnexa: not tender, not palpable and no mass present.    No cervical motion tenderness or discharge.     Uterus is not enlarged, tender or irregular.  Breasts:    Right: Normal.     Left: Normal.  HENT:     Head: Normocephalic.  Neck:     Thyroid : No thyroid  mass, thyromegaly or thyroid  tenderness.  Cardiovascular:     Rate and Rhythm: Normal rate and regular rhythm.     Heart sounds: Normal heart sounds, S1 normal and S2 normal.  Pulmonary:     Effort: Pulmonary effort is normal.     Breath sounds: Normal breath sounds and air entry.  Abdominal:     General: There is no distension.     Palpations: Abdomen is soft. There is no mass.     Tenderness: There is no abdominal tenderness. There is no guarding or rebound.  Musculoskeletal:        General: Normal range of motion.     Cervical back: Full passive range of motion without pain, normal range of motion and neck supple. No tenderness.     Right lower leg: No edema.     Left lower  leg: No edema.  Neurological:     Mental Status: She is alert.  Skin:    General: Skin is warm.  Psychiatric:        Mood and Affect: Mood normal.        Behavior: Behavior normal.        Thought Content: Thought content normal.  Vitals and nursing note reviewed. Exam conducted with a chaperone present.       A:         Well Woman GYN exam                             P:        Pap smear collected today Encouraged annual mammogram screening Colon cancer screening up-to-date DXA ordered today Labs and immunizations to do with PMD Discussed breast self exams Encouraged healthy lifestyle practices Encouraged Vit D and Calcium   No follow-ups on file.  Kathleen Craig

## 2023-10-28 ENCOUNTER — Encounter: Payer: Self-pay | Admitting: Obstetrics and Gynecology

## 2023-10-28 LAB — CYTOLOGY - PAP: Diagnosis: NEGATIVE

## 2024-03-19 ENCOUNTER — Encounter: Payer: Self-pay | Admitting: Obstetrics and Gynecology

## 2024-03-19 ENCOUNTER — Telehealth: Payer: Self-pay | Admitting: Obstetrics and Gynecology

## 2024-03-19 NOTE — Telephone Encounter (Signed)
 BMD:  Has asthma with several years on prednisone now on advair. No fractures. DXA 2018 normal. Was on fosamax and boniva for several years off bone support medications for 20 years. 02/27/22 osteopenia, T score -1.9, FRAX 6.9/0.8% ([facility]) repeat FRAX score to verify 14% and 1.9, since on steroids but normal.  To repeat dxa  in July and may need prolia or evenity based on results. Colonoscopy: 02/01/20 f/u 10 years     Faxed [facility] dxa record reviewed with patient DXA exam on 03/17/24   Osteopenia -1.5 AP spine LFN -1.7  Frax 7.1, 0.6%   2018 normal  Discussed with normal frax but with rapid decline in bone and risk for more loss of options for treatment, if she would like. Importance to stabilize bone at this point to prevent more loss, especially on steroids  Reviewed weight bearing exercises, calcium, vit D, avoid etoh, caffeine and carbonated beverages.  Medications: Bisphosphonate: taken orally on a weekly basis on an empty stomach, side effects reflux and drug inconvenience. Decreases bone resorption but does not build.  Can only take for 3-5 years and you have to take a drug holiday and try another medication. Risk of atypical femur fractures with prolonged use. Not a bone builder.  IV zoledronic acid (reclast): This is given in an IV and is a bisphoshonate once a year for 3 years then take a holiday and start a different medication.  There is about 3% bone building and has benefits on the spine Not at a point for reclast  Evenity injection: 15% bone building. Only one year treatment.  Cannot have a history of MI in the past year. Can cause hypocalcemia  She is not at a point for Evenity. Anabolic agents: PTH hormone. daily SQ dose.  Only use for 2 years Prolia: injection monoclonal antibody, rebound risk for fracture when stopped.  Should stay on this.  Great to stabilize bone after evenity and has slow bone building as well. Discussed UHC coverage is about 350% twice yearly for  this.  SERM: Raloxifene oral pill taken daily. Reduces resorption of bone, lowers risk for breast cancer but has risk of DVT's  She would like to run a PA for prolia and determine for the sure the cost for this.  Discussed repeat bone scan q2year unless starting a medication 1 year after or any new fractures.   Dr. Glennon

## 2024-04-16 ENCOUNTER — Other Ambulatory Visit: Payer: Self-pay | Admitting: *Deleted

## 2024-04-16 DIAGNOSIS — M810 Age-related osteoporosis without current pathological fracture: Secondary | ICD-10-CM

## 2024-04-16 MED ORDER — DENOSUMAB 60 MG/ML ~~LOC~~ SOSY: 60.0000 mg | PREFILLED_SYRINGE | Freq: Once | SUBCUTANEOUS | Status: DC

## 2024-04-16 NOTE — Telephone Encounter (Signed)
 Insurance information submitted to [employer] portal. Will await summary of benefits for prolia .   See referral.   Encounter closed.

## 2024-06-03 ENCOUNTER — Other Ambulatory Visit (HOSPITAL_COMMUNITY): Payer: Self-pay

## 2024-06-03 ENCOUNTER — Other Ambulatory Visit: Payer: Self-pay

## 2024-06-03 ENCOUNTER — Other Ambulatory Visit: Payer: Self-pay | Admitting: Obstetrics and Gynecology

## 2024-06-03 MED ORDER — JUBBONTI 60 MG/ML ~~LOC~~ SOSY
60.0000 mg | PREFILLED_SYRINGE | SUBCUTANEOUS | 5 refills | Status: DC
  Filled 2024-06-03: qty 1, 180d supply, fill #0

## 2024-06-03 MED ORDER — DENOSUMAB 60 MG/ML ~~LOC~~ SOSY
60.0000 mg | PREFILLED_SYRINGE | Freq: Once | SUBCUTANEOUS | 0 refills | Status: DC
  Filled 2024-06-03: qty 1, 1d supply, fill #0

## 2024-06-04 ENCOUNTER — Other Ambulatory Visit: Payer: Self-pay

## 2024-06-04 ENCOUNTER — Telehealth: Payer: Self-pay

## 2024-06-04 NOTE — Telephone Encounter (Signed)
 Pharmacy Patient Advocate Encounter   Received notification from Patient Pharmacy that prior authorization for Prolia  is required/requested.   Insurance verification completed.   The patient is insured through [facility]   Per test claim: PA required; PA submitted to above mentioned insurance via Latent Key/confirmation #/EOC [id number] Status is pending

## 2024-06-04 NOTE — Telephone Encounter (Signed)
 Patient called and she has tried fosamax, boniva and actonel in the past without improvement and worried about the risk of AFF with prolonged bisphosphonate use.   She will need prolia . Dr. Glennon

## 2024-06-05 ENCOUNTER — Other Ambulatory Visit: Payer: Self-pay

## 2024-06-05 ENCOUNTER — Other Ambulatory Visit: Payer: Self-pay | Admitting: Obstetrics and Gynecology

## 2024-06-05 MED ORDER — TERIPARATIDE 560 MCG/2.24ML ~~LOC~~ SOPN
20.0000 ug | PEN_INJECTOR | Freq: Every day | SUBCUTANEOUS | 6 refills | Status: DC
  Filled 2024-06-05: qty 2.24, fill #0

## 2024-06-05 NOTE — Telephone Encounter (Signed)
 Pharmacy Patient Advocate Encounter  Received notification from [employer] that Prior Authorization for Prolia  has been DENIED.   PA #/Case ID/Reference #: [id number]

## 2024-06-05 NOTE — Telephone Encounter (Signed)
 Spoke with patient and verified that she has done actonel, fosamax and boniva for her bone support. She would like to prolia  and avoid the risk of a atypical femur fracture. To let [facility] pharmacy know for PA Dr. Glennon

## 2024-06-05 NOTE — Progress Notes (Signed)
 Pharmacy Patient Advocate Encounter  Insurance verification completed.   The patient is insured through [employer]   Ran test claim for Prolia . PA required.  This test claim was processed through [facility]- copay amounts may vary at other pharmacies due to pharmacy/plan contracts, or as the patient moves through the different stages of their insurance plan.

## 2024-06-08 ENCOUNTER — Other Ambulatory Visit: Payer: Self-pay

## 2024-06-17 ENCOUNTER — Other Ambulatory Visit: Payer: Self-pay | Admitting: *Deleted

## 2024-06-17 DIAGNOSIS — M810 Age-related osteoporosis without current pathological fracture: Secondary | ICD-10-CM

## 2024-06-18 ENCOUNTER — Encounter: Payer: Self-pay | Admitting: *Deleted

## 2024-06-18 MED ORDER — DENOSUMAB 60 MG/ML ~~LOC~~ SOSY
60.0000 mg | PREFILLED_SYRINGE | Freq: Once | SUBCUTANEOUS | Status: AC
  Administered 2024-07-17: 60 mg via SUBCUTANEOUS

## 2024-06-18 NOTE — Addendum Note (Signed)
 Addended by: GLENNON ALMARIE POUR on: 06/18/2024 08:04 PM   Modules accepted: Orders

## 2024-06-19 ENCOUNTER — Telehealth: Payer: Self-pay | Admitting: Pharmacist

## 2024-06-19 ENCOUNTER — Telehealth: Payer: Self-pay

## 2024-06-19 NOTE — Telephone Encounter (Signed)
 Prolia  VOB initiated via [email or web address]  Next Prolia  inj DUE: NEW START

## 2024-06-19 NOTE — Telephone Encounter (Signed)
 Kathleen Craig

## 2024-06-22 NOTE — Telephone Encounter (Signed)
 Appeal has been submitted. Will advise when response is received or follow up in 1 week. Please be advised that most companies may take 30 days to make a decision. Appeal letter and supporting documentation have been faxed to [phone] on 06/22/2024 @9 :06 am.  Thank you, Devere Pandy, PharmD Clinical Pharmacist  [place]  Direct Dial: [phone]

## 2024-06-23 NOTE — Telephone Encounter (Signed)
 Insurance has approved the appeal for Prolia , full letter can be found under the media tab.  Thank you, Devere Pandy, PharmD Clinical Pharmacist  [place]  Direct Dial: [phone]

## 2024-06-25 ENCOUNTER — Other Ambulatory Visit (HOSPITAL_COMMUNITY): Payer: Self-pay

## 2024-06-25 NOTE — Telephone Encounter (Signed)
 MEDICAL PA SUBMITTED VIA UMR PORTAL

## 2024-06-25 NOTE — Telephone Encounter (Signed)
 SABRA

## 2024-06-26 ENCOUNTER — Other Ambulatory Visit (HOSPITAL_COMMUNITY): Payer: Self-pay

## 2024-06-26 ENCOUNTER — Other Ambulatory Visit: Payer: Self-pay

## 2024-06-26 NOTE — Telephone Encounter (Signed)
 Please note that there is no coverage for Prolia  through the MD Purchase pathway. Specialty Pharmacy is mandatory.    PHARMACY COPAY: $562.

## 2024-06-26 NOTE — Telephone Encounter (Signed)
 Proceed with MTDM services  Med obtained from pharmacy and shipped to clinic:  Pharmacy benefit: Copay [phone] (Paid to pharmacy) Admin Fee: 20% (Pay at clinic)  Prior Auth: APPROVED PA# [id number] Expiration Date: 06/23/24-12/21/24  # of doses approved: 1   Patient IS eligible for Copay Card. Copay Card can make patient's cost as little as $25. Link to apply: https://www.amgensupportplus.[email or web address]/copay  ** This summary of benefits is an estimation of the patient's out-of-pocket cost. Exact cost may very based on individual plan coverage.

## 2024-06-29 ENCOUNTER — Ambulatory Visit: Attending: Obstetrics and Gynecology

## 2024-06-29 ENCOUNTER — Other Ambulatory Visit: Payer: Self-pay | Admitting: *Deleted

## 2024-06-29 ENCOUNTER — Other Ambulatory Visit: Payer: Self-pay

## 2024-06-29 DIAGNOSIS — M85851 Other specified disorders of bone density and structure, right thigh: Secondary | ICD-10-CM

## 2024-06-29 MED ORDER — DENOSUMAB 60 MG/ML ~~LOC~~ SOSY
60.0000 mg | PREFILLED_SYRINGE | Freq: Once | SUBCUTANEOUS | 1 refills | Status: AC
  Filled 2024-06-29: qty 1, 91d supply, fill #0

## 2024-06-29 MED ORDER — DENOSUMAB 60 MG/ML ~~LOC~~ SOSY: 60.0000 mg | PREFILLED_SYRINGE | Freq: Once | SUBCUTANEOUS | 1 refills | Status: DC

## 2024-06-29 NOTE — Progress Notes (Signed)
 [facility]  Referring Provider: Almarie Carpen  Virtual Visit via Telephone Note  I connected with Kathleen Craig Sharps on 06/29/24 at 12:00 PM EST by telephone and verified that I am speaking with the correct person using two identifiers.  Location: Patient: home Provider: office   I discussed the limitations, risks, security and privacy concerns of performing an evaluation and management service by telephone and the availability of in person appointments. I also discussed with the patient that there may be a patient responsible charge related to this service. The patient expressed understanding and agreed to proceed.   HPI: Kathleen Craig is a 58 y.o. female who presents to the pharmacotherapy clinic via telephone for initiation of Prolia  for osetoporosis.  Patient Active Problem List   Diagnosis Date Noted   Osteopenia of neck of right femur 03/03/2022   Mild episode of recurrent major depressive disorder 05/19/2020   Chronic bilateral low back pain without sciatica 01/26/2020   Class 1 obesity due to excess calories without serious comorbidity in adult 01/26/2020   Elevated LDL cholesterol level 01/26/2020   Herpes simplex vulvovaginitis 01/26/2020   Moderate persistent asthma without complication 01/26/2020   Overweight 01/26/2020   Conductive hearing loss of right ear with unrestricted hearing of left ear 10/16/2018   Pulsatile tinnitus of right ear 10/16/2018   Vitamin D  deficiency 08/22/2017   Cervical high risk HPV (human papillomavirus) test positive 10/16/2016   History of osteopenia 10/16/2016   H/O basal cell carcinoma excision 05/26/2015   Anxiety and depression 03/23/2015   Asthma, mild intermittent, well-controlled 03/23/2015    Patient's Medications  New Prescriptions   No medications on file  Previous Medications   CLOBETASOL  OINTMENT (TEMOVATE ) 0.05 %    Apply 1 Application topically 2 (two) times daily. Apply a pea sized amount  twice  daily for up to 2 weeks as needed   DENOSUMAB  (PROLIA ) 60 MG/ML SOSY INJECTION    Inject 60 mg into the skin once for 1 dose.   ESCITALOPRAM  (LEXAPRO ) 20 MG TABLET    Take 1 tablet (20 mg total) by mouth daily.   FAMOTIDINE (PEPCID) 20 MG TABLET    Take by mouth.   FLUTICASONE -SALMETEROL (ADVAIR DISKUS) 100-50 MCG/DOSE AEPB    Inhale 1 puff into the lungs 2 (two) times daily.   METAXALONE  (SKELAXIN ) 800 MG TABLET    Take 0.5-1 tablets (400-800 mg total) by mouth 3 (three) times daily. Take only as needed.   MULTIPLE VITAMIN (MULTIVITAMIN) CAPSULE    Take 1 capsule by mouth daily.   PRASTERONE  (INTRAROSA ) 6.5 MG INST    Place 1 suppository vaginally at bedtime as needed.   ROSUVASTATIN (CRESTOR) 10 MG TABLET    Take 10 mg by mouth at bedtime.   TERIPARATIDE (FORTEO) 560 MCG/2.24ML SOPN    Inject 20 mcg into the skin daily.   VALACYCLOVIR  (VALTREX ) 500 MG TABLET    TAKE 1 TABLET BY MOUTH EVERY DAY, INCREASE TO 1 TABLET TWICE DAILY FOR 3 DAYS FOR OUTBREAK   VITAMIN D  PO    Take by mouth.  Modified Medications   No medications on file  Discontinued Medications   No medications on file    Allergies: Allergies  Allergen Reactions   Elemental Sulfur Hives   Metronidazole Itching    Past Medical History: Past Medical History:  Diagnosis Date   Abnormal Pap smear of cervix 2000   Hx of LEEP and paps normal since   Allergy  Anxiety    Asthma    Cancer (HCC) 2016, 2010   basal cell on head and collarbone   Depression    osteopenia 03/17/24 -1.7 [facility] Spine [facility] 7.1, 0.6% she is interested to prolia     STD (sexually transmitted disease)    HSV II    Social History: Social History   Socioeconomic History   Marital status: Married    Spouse name: Not on file   Number of children: Not on file   Years of education: Not on file   Highest education level: Not on file  Occupational History   Occupation: accounting    Comment: Kathleen Craig  Tobacco Use   Smoking  status: Never   Smokeless tobacco: Never  Vaping Use   Vaping status: Never Used  Substance and Sexual Activity   Alcohol use: Not Currently    Comment: rarely   Drug use: No   Sexual activity: Yes    Comment: partner w/ vasectomy  Other Topics Concern   Not on file  Social History Narrative   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 Kathleen Craig   Exercise - no   Diet - try to eat healthy - could make some changes   Social Drivers of Health   Financial Resource Strain: Low Risk  (11/17/2023)   Received from [facility]   Overall Financial Resource Strain (CARDIA)    Difficulty of Paying Living Expenses: Not very hard  Food Insecurity: No Food Insecurity (11/17/2023)   Received from [facility]   Hunger Vital Sign    Within the past 12 months, you worried that your food would run out before you got the money to buy more.: Never true    Within the past 12 months, the food you bought just didn't last and you didn't have money to get more.: Never true  Transportation Needs: No Transportation Needs (11/17/2023)   Received from [facility] - Transportation    Lack of Transportation (Medical): No    Lack of Transportation (Non-Medical): No  Physical Activity: Insufficiently Active (11/17/2023)   Received from [facility]   Exercise Vital Sign    On average, how many days per week do you engage in moderate to strenuous exercise (like a brisk walk)?: 1 day    On average, how many minutes do you engage in exercise at this level?: 10 min  Stress: No Stress Concern Present (11/17/2023)   Received from [facility] of Occupational Health - Occupational Stress Questionnaire    Feeling of Stress : Only a little  Social Connections: Moderately Integrated (11/17/2023)   Received from [facility]   Social Network    How would you rate your social network (family, work, friends)?: Adequate participation with social networks    Medication: Prolia   (denosumab )  Assessment: Patient will initiate Prolia  injections for osteoporosis.  Vitamin D  Lab Results  Component Value Date   VD25OH 33.9 08/28/2017    Calcium Lab Results  Component Value Date   CALCIUM 9.7 10/07/2019    DEXA scan, most current DEXA scan T-score is minus 1.7 (03/17/24). Patient has history of corticosteroid use for asthma. Patient has been on bisphosphonates previously with concerns of long term risks.  Plan:  -Counseled patient on purpose, proper use, and adverse effects of Prolia  (denosumab ).  Counseled patient that medication must be injected every 6 months by a healthcare professional.  Advised patient to take calcium 1200 mg daily  and vitamin D  800 units daily.  Reviewed the most common adverse effects including risk of infection, osteonecrosis of the jaw, rash, and muscle/bone pain.  Patient confirms she does not have any major dental work planned at this time.  Reviewed with patient the signs/symptoms of low calcium and advised patient to alert us  if she experiences these symptoms.  - Patient will be scheduled for new start Prolia  (denosumab ) appointment at [facility] when medication arrives to office.  - Rx will be triaged to [facility] for courier to [facility].   I discussed the assessment and treatment plan with the patient. The patient was provided an opportunity to ask questions and all were answered. The patient agreed with the plan and demonstrated an understanding of the instructions.   The patient was advised to call back or seek an in-person evaluation if the symptoms worsen or if the condition fails to improve as anticipated.  I provided 20 minutes of non-face-to-face time during this encounter.  Delon Brow, PharmD, CSP, AAHIVP, CPP Clinical Pharmacist Practitioner - Medication Therapy Disease Management/Specialty Pharmacy Services 06/29/2024, 11:44 AM

## 2024-06-29 NOTE — Progress Notes (Addendum)
 Specialty Pharmacy Initial Fill Coordination Note  Kathleen Craig is a 58 y.o. female contacted today regarding initial fill of specialty medication(s) Denosumab  (Prolia )  Patient requested Courier to Provider Office   Delivery date: 07/02/24   Verified address: [facility] - [address], [place] [address]   Medication will be filled on: 07/01/24   Patient is aware of $0 copayment.

## 2024-06-29 NOTE — Telephone Encounter (Signed)
 Prolia  #1, 1RF sent to [facility].

## 2024-07-01 ENCOUNTER — Other Ambulatory Visit: Payer: Self-pay

## 2024-07-02 ENCOUNTER — Other Ambulatory Visit: Payer: Self-pay

## 2024-07-07 ENCOUNTER — Other Ambulatory Visit: Payer: Self-pay

## 2024-07-17 ENCOUNTER — Ambulatory Visit

## 2024-07-17 DIAGNOSIS — M810 Age-related osteoporosis without current pathological fracture: Secondary | ICD-10-CM

## 2024-07-17 NOTE — Progress Notes (Signed)
 Prolia  injection given SQ left arm.  Patient tolerated injection well. Annual exam: 10/25/23 EB Calcium: 9.2 Date: 04/21/24 Upcoming dental procedures: No Hx of Kidney Disease: No Last Bone Density Scan: 03/17/24
